# Patient Record
Sex: Male | Born: 1985 | Race: Black or African American | Hispanic: No | Marital: Single | Smoking: Current some day smoker
Health system: Southern US, Community
[De-identification: ages and names within clinical notes are randomized; demographics above are authoritative.]

## PROBLEM LIST (undated history)

## (undated) HISTORY — PX: HERNIA REPAIR: SHX51

---
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
## 2007-04-30 ENCOUNTER — Emergency Department (HOSPITAL_COMMUNITY): Admission: EM | Admit: 2007-04-30 | Discharge: 2007-04-30 | Payer: Self-pay | Admitting: Emergency Medicine

## 2007-12-18 ENCOUNTER — Encounter (INDEPENDENT_AMBULATORY_CARE_PROVIDER_SITE_OTHER): Payer: Self-pay | Admitting: Otolaryngology

## 2007-12-18 ENCOUNTER — Inpatient Hospital Stay (HOSPITAL_COMMUNITY): Admission: EM | Admit: 2007-12-18 | Discharge: 2007-12-19 | Payer: Self-pay | Admitting: *Deleted

## 2007-12-18 ENCOUNTER — Encounter: Payer: Self-pay | Admitting: Emergency Medicine

## 2008-12-07 ENCOUNTER — Emergency Department (HOSPITAL_COMMUNITY): Admission: EM | Admit: 2008-12-07 | Discharge: 2008-12-07 | Payer: Self-pay | Admitting: Emergency Medicine

## 2010-04-13 ENCOUNTER — Emergency Department (HOSPITAL_COMMUNITY)
Admission: EM | Admit: 2010-04-13 | Discharge: 2010-04-13 | Payer: Self-pay | Source: Home / Self Care | Admitting: Emergency Medicine

## 2010-06-26 LAB — RAPID STREP SCREEN (MED CTR MEBANE ONLY): Streptococcus, Group A Screen (Direct): NEGATIVE

## 2010-06-26 LAB — MONONUCLEOSIS SCREEN: Mono Screen: NEGATIVE
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
## 2010-12-11 LAB — RAPID STREP SCREEN (MED CTR MEBANE ONLY): Streptococcus, Group A Screen (Direct): POSITIVE — AB

## 2010-12-21 LAB — RAPID URINE DRUG SCREEN, HOSP PERFORMED
Amphetamines: NOT DETECTED
Barbiturates: NOT DETECTED
Benzodiazepines: NOT DETECTED
Cocaine: NOT DETECTED
Opiates: NOT DETECTED
Tetrahydrocannabinol: NOT DETECTED

## 2010-12-21 LAB — ETHANOL: Alcohol, Ethyl (B): 244 — ABNORMAL HIGH

## 2011-09-10 ENCOUNTER — Encounter (HOSPITAL_COMMUNITY): Payer: Self-pay | Admitting: *Deleted

## 2011-09-10 ENCOUNTER — Emergency Department (HOSPITAL_COMMUNITY): Payer: Self-pay

## 2011-09-10 ENCOUNTER — Emergency Department (HOSPITAL_COMMUNITY)
Admission: EM | Admit: 2011-09-10 | Discharge: 2011-09-10 | Disposition: A | Discharge status: Home / Self Care | Payer: Self-pay | Attending: Emergency Medicine | Admitting: Emergency Medicine

## 2011-09-10 DIAGNOSIS — M25539 Pain in unspecified wrist: Secondary | ICD-10-CM | POA: Insufficient documentation

## 2011-09-10 DIAGNOSIS — F17200 Nicotine dependence, unspecified, uncomplicated: Secondary | ICD-10-CM | POA: Insufficient documentation

## 2011-09-10 DIAGNOSIS — M25531 Pain in right wrist: Secondary | ICD-10-CM

## 2011-09-10 LAB — POCT I-STAT, CHEM 8
BUN: 9 mg/dL (ref 6–23)
Calcium, Ion: 1.22 mmol/L (ref 1.12–1.32)
Chloride: 103 mEq/L (ref 96–112)
Creatinine, Ser: 1.1 mg/dL (ref 0.50–1.35)
Glucose, Bld: 64 mg/dL — ABNORMAL LOW (ref 70–99)
HCT: 48 % (ref 39.0–52.0)
Hemoglobin: 16.3 g/dL (ref 13.0–17.0)
Potassium: 4.3 mEq/L (ref 3.5–5.1)
Sodium: 144 mEq/L (ref 135–145)
TCO2: 27 mmol/L (ref 0–100)

## 2011-09-10 LAB — SEDIMENTATION RATE: Sed Rate: 0 mm/hr (ref 0–16)

## 2011-09-10 MED ORDER — NAPROXEN 375 MG PO TABS: 375.0000 mg | ORAL_TABLET | Freq: Two times a day (BID) | ORAL | Status: DC

## 2011-09-10 MED ORDER — NAPROXEN 375 MG PO TABS: 375.0000 mg | ORAL_TABLET | Freq: Two times a day (BID) | ORAL | Status: AC

## 2011-09-10 NOTE — ED Provider Notes (Signed)
History     CSN: [id number]  Arrival date & time 09/10/11  4098   First MD Initiated Contact with Patient 09/10/11 [phone]      Chief Complaint  Patient presents with  . Wrist Pain    (Consider location/radiation/quality/duration/timing/severity/associated sxs/prior treatment) HPI  With complaints of bilateral wrist pain for greater than one month. He states that he does not remember any trauma to his wrist. 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 He states that they're stiff in the morning and sometimes gets shooting pain certain movements of his wrist. He denies having any pain in any other joints. Denies having fevers, chills, weight loss, penile discharge or any rash that has come and gone over the past couple months. States that he is otherwise a healthy guy with no other chronic diagnoses.  History reviewed. No pertinent past medical history.  History reviewed. No pertinent past surgical history.  No family history on file.  History  Substance Use Topics  . Smoking status: Current Some Day Smoker  . Smokeless tobacco: Not on file  . Alcohol Use: Yes      Review of Systems  CONST: no fevers, chills HEENT: denies blurry vision or change in hearing PULMONARY: Denies difficulty breathing and SOB CARDIAC: denies chest pain or heart palpitations MUSCULOSKELETAL:  denies being unable to ambulate ABDOMEN AL: denies abdominal pain GU: denies loss of bowel or urinary control NEURO: denies numbness and tingling in extremities SKIN: no new rashes PSYCH: patient behavior is normal NECK: Not complaining of neck pain     Allergies  Penicillins  Home Medications   Current Outpatient Rx  Name Route Sig Dispense Refill  . IBUPROFEN 200 MG PO TABS Oral Take 800 mg by mouth every 6 (six) hours as needed. Pain      BP 121/74  Pulse 66  Temp 98.3 F (36.8 C) (Oral)  Resp 16  SpO2  100%  Physical Exam  Nursing note and vitals reviewed. Constitutional: He appears well-developed and well-nourished. No distress.  HENT:  Head: Normocephalic and atraumatic.  Eyes: Pupils are equal, round, and reactive to light.  Neck: Normal range of motion. Neck supple.  Cardiovascular: Normal rate and regular rhythm.   Pulmonary/Chest: Effort normal.  Abdominal: Soft.  Musculoskeletal:       Right wrist: Normal.       Left wrist: Normal.       Negative Tinnels   Neurological: He is alert.  Skin: Skin is warm and dry.      ED Course  Procedures (including critical care time)  Labs Reviewed  POCT I-STAT, CHEM 8 - Abnormal; Notable for the following:    Glucose, Bld 64 (*)     All other components within normal limits  SEDIMENTATION RATE   Dg Wrist Complete Left  09/10/2011  *RADIOLOGY REPORT*  Clinical Data: Wrist pain.  LEFT WRIST - COMPLETE 3+ VIEW  Comparison: None.  Findings: There is no fracture, dislocation, joint space narrowing, or arthritis.  IMPRESSION: Normal exam.  Original Report Authenticated By: Gwynn Burly, M.D.   Dg Wrist Complete Right  09/10/2011  *RADIOLOGY REPORT*  Clinical Data: Wrist pain.  RIGHT WRIST - COMPLETE 3+ VIEW  Comparison: None.  Findings: There is a tiny erosion of the tip of the ulnar styloid with a accessory ossification adjacent to that site.  This erosions could be seen with rheumatoid arthritis.  No other  arthritic changes or other acute abnormalities.  IMPRESSION: Focal erosion of the tip of the ulnar styloid which can be seen with rheumatoid arthritis.  Original Report Authenticated By: Gwynn Burly, M.D.     1. Bilateral wrist pain       MDM  Due to poly symmetrical arthralgias without swelling or redness. I have ordered an I-stat, SED rate and bilateral wrist xrays.    I-stat is normal, aside from a low sugar. The right wrist xray shows that patient has some focal erosion of the tip of the ulnar styloid which can be  seen in RA.  SED rate is 0- will refer patient to Hand. Pt given bilateral wrist splints and started on a two week course of Naprosyn.   Pt has been advised of the symptoms that warrant their return to the ED. Patient has voiced understanding and has agreed to follow-up with the PCP or specialist.        Dorthula Matas, PA 09/10/11 1149

## 2011-09-10 NOTE — ED Notes (Signed)
Pt states he is having wrist pain in both wrist. Pt states this going on for a month. Pt states he is not sure what happened to his wrist. Pt is able to move extremities to command no deformity noted

## 2011-09-12 NOTE — ED Provider Notes (Signed)
Medical screening examination/treatment/procedure(s) were performed by non-physician practitioner and as supervising physician I was immediately available for consultation/collaboration.    Ileah Falkenstein L Guage Efferson, MD 09/12/11 0708 

## 2014-01-18 ENCOUNTER — Encounter (HOSPITAL_COMMUNITY): Payer: Self-pay | Admitting: Emergency Medicine

## 2014-01-18 ENCOUNTER — Emergency Department (HOSPITAL_COMMUNITY)
Admission: EM | Admit: 2014-01-18 | Discharge: 2014-01-19 | Disposition: A | Discharge status: Home / Self Care | Payer: Self-pay | Attending: Emergency Medicine | Admitting: Emergency Medicine

## 2014-01-18 ENCOUNTER — Emergency Department (HOSPITAL_COMMUNITY): Payer: Self-pay

## 2014-01-18 DIAGNOSIS — R52 Pain, unspecified: Secondary | ICD-10-CM

## 2014-01-18 DIAGNOSIS — Z720 Tobacco use: Secondary | ICD-10-CM | POA: Insufficient documentation

## 2014-01-18 DIAGNOSIS — Z880 Allergy status to penicillin: Secondary | ICD-10-CM | POA: Insufficient documentation

## 2014-01-18 DIAGNOSIS — L03011 Cellulitis of right finger: Secondary | ICD-10-CM | POA: Insufficient documentation

## 2014-01-18 MED ORDER — HYDROCODONE-ACETAMINOPHEN 5-325 MG PO TABS: 1.0000 | ORAL_TABLET | ORAL | Status: DC | PRN

## 2014-01-18 MED ORDER — LIDOCAINE HCL (PF) 1 % IJ SOLN
5.0000 mL | Freq: Once | INTRAMUSCULAR | Status: AC
  Administered 2014-01-18: 5 mL
  Filled 2014-01-18: qty 5

## 2014-01-18 MED ORDER — SULFAMETHOXAZOLE-TRIMETHOPRIM 800-160 MG PO TABS: 1.0000 | ORAL_TABLET | Freq: Two times a day (BID) | ORAL | Status: AC

## 2014-01-18 NOTE — Discharge Instructions (Signed)
Read the information below.  Use the prescribed medication as directed.  Please discuss all new medications with your pharmacist.  Do not take additional tylenol while taking the prescribed pain medication to avoid overdose.  You may return to the Emergency Department at any time for worsening condition or any new symptoms that concern you.  If you develop redness, swelling, pus draining from the wound, uncontrolled pain, are unable to move your finger, or fevers greater than 100.4, return to the ER immediately for a recheck.     Paronychia  Paronychia is an infection of the skin caused by germs. It happens by the fingernail or toenail. You can avoid it by not:  Pulling on hangnails.  Nail biting.  Thumb sucking.  Cutting fingernails and toenails too short.  Cutting the skin at the base and sides of the fingernail or toenail (cuticle). HOME CARE  Keep the fingers or toes very dry. Put rubber gloves over cotton gloves when putting hands in water.  Keep the wound clean and bandaged (dressed) as told by your doctor.  Soak the fingers or toes in warm water for 15 to 20 minutes. Soak them 3 to 4 times per day for germ infections. Fungal infections are difficult to treat. Fungal infections often require treatment for a long time.  Only take medicine as told by your doctor. GET HELP RIGHT AWAY IF:   You have redness, puffiness (swelling), or pain that gets worse.  You see yellowish-white fluid (pus) coming from the wound.  You have a fever.  You have a bad smell coming from the wound or bandage. MAKE SURE YOU:  Understand these instructions.  Will watch your condition.  Will get help if you are not doing well or get worse. Document Released: 02/24/2009 Document Revised: 05/31/2011 Document Reviewed: 02/24/2009 [facility] Patient Information 2015 [employer] [place]. This information is not intended to replace advice given to you by your health care provider. Make sure you discuss any  questions you have with your health care provider.

## 2014-01-18 NOTE — ED Notes (Signed)
Sl swelling noted distal phalanx palmar surface

## 2014-01-18 NOTE — ED Provider Notes (Signed)
CSN: [id number]     Arrival date & time 01/18/14  2103 History  This chart was scribed for [facility] Institute D[facility] Jersey Mus[facility] Institute LLCEmily Yoselyn Mcglade, GeorgiaPA, working with Raelyn NumberKristen N Ward, DO found by Elon SpannerGarrett Cook, ED Scribe. This patient was seen in room TR04C/TR04C and the patient's care was started at 9:55 PM.  Chief Complaint  Patient presents with  . finger pain    The history is provided by the patient. No language interpreter was used.   HPI Comments: William Marsh is a 28 y.o. male who presents to the Emergency Department complaining of right 1st finger pain onset 1 month ago with general worsening and associated swelling onset 2 days ago. 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  William Marsh reports at baseline his pain is 3/10 but states if William Marsh aggravates the area by hitting it on something the pain is 10/10.  Patient denies fever, drainage, weakness, numbness.  Patient is allergic to penicillin.  History reviewed. No pertinent past medical history. History reviewed. No pertinent past surgical history. No family history on file. History  Substance Use Topics  . Smoking status: Current Some Day Smoker  . Smokeless tobacco: Not on file  . Alcohol Use: Yes    Review of Systems  Constitutional: Negative for fever and chills.  Musculoskeletal: Negative for arthralgias and joint swelling.  Skin: Negative for color change and wound.  Allergic/Immunologic: Negative for immunocompromised state.  Neurological: Negative for weakness and numbness.  Psychiatric/Behavioral: Negative for self-injury.      Allergies  Penicillins  Home Medications   Prior to Admission medications   Medication Sig Start Date End Date Taking? Authorizing Provider  ibuprofen (ADVIL,MOTRIN) 200 MG tablet Take 800 mg by mouth every 6 (six) hours as needed. Pain    Historical Provider, MD   BP 131/70  Pulse 90  Temp(Src) 98.5 F (36.9 C)  Resp 18  Ht 6' (1.829 m)  Wt 170 lb (77.111 kg)  BMI 23.05 kg/m2  SpO2 100% Physical Exam   Nursing note and vitals reviewed. Constitutional: William Marsh appears well-developed and well-nourished. No distress.  HENT:  Head: Normocephalic and atraumatic.  Neck: Neck supple.  Pulmonary/Chest: Effort normal.  Musculoskeletal:  Right index finger with swelling and tenderness along the nailfold and diffuse edema of the surrounding pulp.  Not tender throughout digital pulp.  Compartment is soft.  No area of fluctuance.  No discharge.  No overlying erythema.   Neurological: William Marsh is alert.  Skin: William Marsh is not diaphoretic.    ED Course  Procedures (including critical care time)  DIAGNOSTIC STUDIES: Oxygen Saturation is 100% on RA, normal by my interpretation.    COORDINATION OF CARE:  10:04 PM Will perform I&D.  Patient acknowledges and agrees with plan.    Labs Review Labs Reviewed - No data to display  Imaging Review No results found.   EKG Interpretation None      INCISION AND DRAINAGE Performed by: Trixie DredgeWEST, Banks Chaikin Consent: Verbal consent obtained. Risks and benefits: risks, benefits and alternatives were discussed Type: abscess  Body area: right index finger, paronychia   Anesthesia: digital block  Incision was made with a scalpel.  Local anesthetic: lidocaine 2% no epinephrine  Anesthetic total: 4 ml  Complexity: simple  Drainage: purulent  Drainage amount: moderate  Packing material: none  Patient tolerance: Patient tolerated the procedure well with no immediate complications.    MDM   Final diagnoses:  Paronychia of finger of right hand    Afebrile, nontoxic patient with  paronychia of right index finger, dominant hand.  Doubt felon or herpetic whitlow.  I&D with moderate amount of drainage.   D/C home with bactrim, norco.  Discussed return precautions with patient.    Discussed result, findings, treatment, and follow up  with patient.  Pt given return precautions.  Pt verbalizes understanding and agrees with plan.       I personally performed the services  described in this documentation, which was scribed in my presence. The recorded information has been reviewed and is accurate.    Trixie DredgeEmily Amauri Keefe, PA-C 01/19/14 0040

## 2014-01-18 NOTE — ED Notes (Signed)
The pt has had rt index finger pain   For 3 weeks .  He does not know if he has injured it.  No swelling

## 2014-01-19 MED ORDER — IBUPROFEN 400 MG PO TABS
400.0000 mg | ORAL_TABLET | Freq: Once | ORAL | Status: AC
  Administered 2014-01-19: 400 mg via ORAL
  Filled 2014-01-19: qty 1

## 2014-01-19 NOTE — ED Provider Notes (Signed)
Medical screening examination/treatment/procedure(s) were performed by non-physician practitioner and as supervising physician I was immediately available for consultation/collaboration.   EKG Interpretation None        Layla MawKristen N Hendrick Pavich, DO 01/19/14 [phone]

## 2014-08-01 ENCOUNTER — Emergency Department (HOSPITAL_COMMUNITY): Payer: Self-pay

## 2014-08-01 ENCOUNTER — Encounter (HOSPITAL_COMMUNITY): Payer: Self-pay | Admitting: Emergency Medicine

## 2014-08-01 ENCOUNTER — Emergency Department (HOSPITAL_COMMUNITY)
Admission: EM | Admit: 2014-08-01 | Discharge: 2014-08-01 | Disposition: A | Discharge status: Home / Self Care | Payer: Self-pay | Attending: Emergency Medicine | Admitting: Emergency Medicine

## 2014-08-01 DIAGNOSIS — Z720 Tobacco use: Secondary | ICD-10-CM | POA: Insufficient documentation

## 2014-08-01 DIAGNOSIS — M25531 Pain in right wrist: Secondary | ICD-10-CM | POA: Insufficient documentation

## 2014-08-01 DIAGNOSIS — L301 Dyshidrosis [pompholyx]: Secondary | ICD-10-CM | POA: Insufficient documentation

## 2014-08-01 DIAGNOSIS — Z880 Allergy status to penicillin: Secondary | ICD-10-CM | POA: Insufficient documentation

## 2014-08-01 DIAGNOSIS — G8929 Other chronic pain: Secondary | ICD-10-CM | POA: Insufficient documentation

## 2014-08-01 MED ORDER — HYDROCORTISONE 1 % EX CREA: TOPICAL_CREAM | CUTANEOUS | Status: DC

## 2014-08-01 MED ORDER — NAPROXEN 500 MG PO TABS: 500.0000 mg | ORAL_TABLET | Freq: Two times a day (BID) | ORAL | Status: DC

## 2014-08-01 NOTE — ED Provider Notes (Signed)
CSN: [id number]     Arrival date & time 08/01/14  1252 History  This chart was scribed for Joycie PeekBenjamin Savyon Loken, PA-C, working with Elwin MochaBlair Walden, MD by Elon SpannerGarrett Cook, ED Scribe. This patient was seen in room TR07C/TR07C and the patient's care was started at 1:08 PM.   Chief Complaint  Patient presents with  . Wrist Pain  . Hand Pain   Patient is a 29 y.o. male presenting with hand pain. The history is provided by the patient. No language interpreter was used.  Hand Pain   HPI Comments: William Marsh is a 29 y.o. male who presents to the Emergency Department complaining of right medial wrist pain onset yesterday upon awaking without injury.  He notes limited mobility in the thumb and "shocking" pain with rotational movements and touching.    The patient has not taken any medications for this complaint and there are no alleviating factors.  Patient reports he had a similar complaint 3 years ago and was seen in the ED where he was treated with a wrist splint and the complaint resolved.  The onset of this prior complaint was gradual and the patient worked lifting boxes at the time.  Patient is allergic to Penicillin.  He also complains of left middle finger, non-tender skin peeling and itching.  He notes he previously had a similar complaint on the left hand that required an incision and drainage.  He has used cocoa butter and Vaseline.  He works as Public affairs consultantdishwasher.  Patient denies fever, chills, dysuria, difficulty urinating, penile discharge, other pain.    History reviewed. No pertinent past medical history. History reviewed. No pertinent past surgical history. No family history on file. History  Substance Use Topics  . Smoking status: Current Some Day Smoker  . Smokeless tobacco: Not on file  . Alcohol Use: Yes    Review of Systems  Constitutional: Negative for fever and chills.  Genitourinary: Negative for dysuria, discharge and difficulty urinating.  Musculoskeletal: Positive for arthralgias.   Skin: Positive for wound.  All other systems reviewed and are negative.     Allergies  Penicillins  Home Medications   Prior to Admission medications   Medication Sig Start Date End Date Taking? Authorizing Provider  HYDROcodone-acetaminophen (NORCO/VICODIN) 5-325 MG per tablet Take 1-2 tablets by mouth every 4 (four) hours as needed for moderate pain or severe pain. 01/18/14   Trixie DredgeEmily West, PA-C  hydrocortisone cream 1 % Apply to affected area 2 times daily 08/01/14   Joycie PeekBenjamin Talin Rozeboom, PA-C  ibuprofen (ADVIL,MOTRIN) 200 MG tablet Take 800 mg by mouth every 6 (six) hours as needed. Pain    Historical Provider, MD  naproxen (NAPROSYN) 500 MG tablet Take 1 tablet (500 mg total) by mouth 2 (two) times daily. 08/01/14   Wyatte Dames, PA-C   BP 113/70 mmHg  Pulse 60  Temp(Src) 98.1 F (36.7 C) (Oral)  Resp 14  Ht 6\' 1"  (1.854 m)  Wt 177 lb (80.287 kg)  BMI 23.36 kg/m2  SpO2 100% Physical Exam  Constitutional: He is oriented to person, place, and time. He appears well-developed and well-nourished. No distress.  HENT:  Head: Normocephalic and atraumatic.  Mouth/Throat: Oropharynx is clear and moist.  No lesions  Eyes: Conjunctivae and EOM are normal.  Neck: Neck supple. No tracheal deviation present.  Cardiovascular: Normal rate.   Pulmonary/Chest: Effort normal. No respiratory distress.  Abdominal: Soft. There is no tenderness.  Musculoskeletal: Normal range of motion.  Right wrist: TTP over radial styloid with no  obvious lesions or deformities; decreased ROM with wrist flexion secondary to pain; full active ROM with all digits.    Neurological: He is alert and oriented to person, place, and time.  Negative Tinel's. Sensation intact to light touch. Motor 5/5. Grip strength intact.  Skin: Skin is warm and dry.  Left hand middle finger: mild scaling and skin breakdown throughout entire digit; some small deep vesicular lesions; no active drainage; no erythema. Non painful, full  active ROM.  No TTP.   Psychiatric: He has a normal mood and affect. His behavior is normal.  Nursing note and vitals reviewed.   ED Course  Procedures (including critical care time)  DIAGNOSTIC STUDIES: Oxygen Saturation is 100% on RA, normal by my interpretation.    COORDINATION OF CARE:  1:18 PM Discussed treatment plan with patient at bedside.  Patient acknowledges and agrees with plan.    Labs Review Labs Reviewed - No data to display  Imaging Review Dg Wrist Complete Right  08/01/2014   CLINICAL DATA:  Right-sided wrist pain, no known injury, initial encounter  EXAM: RIGHT WRIST - COMPLETE 3+ VIEW  COMPARISON:  None.  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 No other focal bony abnormality is seen. No soft tissue changes are noted.  IMPRESSION: No acute abnormality noted.   Electronically Signed   By: Alcide CleverMark  Lukens M.D.   On: 08/01/2014 13:38     EKG Interpretation None        MDM   Vitals stable - WNL -afebrile Pt resting comfortably in ED. Wrist discomfort appears to be secondary to musculoskeletal strain. No obvious bacterial or other immunologic processes evident. Skin manifestations on left middle finger appear to be dyshidrotic eczema.No evidence of felon or paronychia. No other clinical signs of viral infection. No evidence of herpetic whitlow, no erythema or tenderness.  Imaging--x-ray right wrist shows no acute fractures, dislocations or other bony pathology.  DDX--we'll give wrist splint, initiate trial of NSAIDs.  Also DC with steroid cream and instructions to wear elbow length gloves while washing dishes. Continue with hand lotions at home  I discussed all relevant lab findings and imaging results with pt and they verbalized understanding. Discussed f/u with PCP within 48 hrs and return precautions, pt very amenable to plan.    Final diagnoses:  Wrist pain, chronic, right  Dyshidrotic eczema   I personally performed the  services described in this documentation, which was scribed in my presence. The recorded information has been reviewed and is accurate.   Joycie PeekBenjamin Genaro Bekker, PA-C 08/01/14 1439  Elwin MochaBlair Walden, MD 08/01/14 [phone]

## 2014-08-01 NOTE — Discharge Instructions (Signed)
Please take your medications as directed for your wrist pain and rash on your finger. He may apply the cream to your finger as directed on the tube. It is important to follow-up with primary care for further evaluation and management of your symptoms. Return to ED for new or worsening symptoms.  Arthralgia Your caregiver has diagnosed you as suffering from an arthralgia. Arthralgia means there is pain in a joint. This can come from many reasons including:  Bruising the joint which causes soreness (inflammation) in the joint.  Wear and tear on the joints which occur as we grow older (osteoarthritis).  Overusing the joint.  Various forms of arthritis.  Infections of the joint. Regardless of the cause of pain in your joint, most of these different pains respond to anti-inflammatory drugs and rest. The exception to this is when a joint is infected, and these cases are treated with antibiotics, if it is a bacterial infection. HOME CARE INSTRUCTIONS   Rest the injured area for as long as directed by your caregiver. Then slowly start using the joint as directed by your caregiver and as the pain allows. Crutches as directed may be useful if the ankles, knees or hips are involved. If the knee was splinted or casted, continue use and care as directed. If an stretchy or elastic wrapping bandage has been applied today, it should be removed and re-applied every 3 to 4 hours. It should not be applied tightly, but firmly enough to keep swelling down. Watch toes and feet for swelling, bluish discoloration, coldness, numbness or excessive pain. If any of these problems (symptoms) occur, remove the ace bandage and re-apply more loosely. If these symptoms persist, contact your caregiver or return to this location.  For the first 24 hours, keep the injured extremity elevated on pillows while lying down.  Apply ice for 15-20 minutes to the sore joint every couple hours while awake for the first half day. Then 03-04  times per day for the first 48 hours. Put the ice in a plastic bag and place a towel between the bag of ice and your skin.  Wear any splinting, casting, elastic bandage applications, or slings as instructed.  Only take over-the-counter or prescription medicines for pain, discomfort, or fever as directed by your caregiver. Do not use aspirin immediately after the injury unless instructed by your physician. Aspirin can cause increased bleeding and bruising of the tissues.  If you were given crutches, continue to use them as instructed and do not resume weight bearing on the sore joint until instructed. Persistent pain and inability to use the sore joint as directed for more than 2 to 3 days are warning signs indicating that you should see a caregiver for a follow-up visit as soon as possible. Initially, a hairline fracture (break in bone) may not be evident on X-rays. Persistent pain and swelling indicate that further evaluation, non-weight bearing or use of the joint (use of crutches or slings as instructed), or further X-rays are indicated. X-rays may sometimes not show a small fracture until a week or 10 days later. Make a follow-up appointment with your own caregiver or one to whom we have referred you. A radiologist (specialist in reading X-rays) may read your X-rays. Make sure you know how you are to obtain your X-ray results. Do not assume everything is normal if you do not hear from [place]. SEEK MEDICAL CARE IF: Bruising, swelling, or pain increases. SEEK IMMEDIATE MEDICAL CARE IF:   Your fingers or toes  are numb or blue.  The pain is not responding to medications and continues to stay the same or get worse.  The pain in your joint becomes severe.  You develop a fever over 102 F (38.9 C).  It becomes impossible to move or use the joint. MAKE SURE YOU:   Understand these instructions.  Will watch your condition.  Will get help right away if you are not doing well or get worse. Document  Released: 03/08/2005 Document Revised: 05/31/2011 Document Reviewed: 10/25/2007 [facility]ExitCare Patient Information 2015 [place]ExitCare, [place]. This information is not intended to replace advice given to you by your health care provider. Make sure you discuss any questions you have with your health care provider.  Hand Dermatitis Hand dermatitis (dyshidrotic eczema) is a skin condition in which small, itchy, raised dots or fluid-filled blisters form over the palms of the hands. Outbreaks of hand dermatitis can last 3 to 4 weeks. CAUSES  The cause of hand dermatitis is unknown. However, it occurs most often in patients with a history of allergies such as:  Hay fever.  Allergic asthma.  Allergies to latex. Chemical exposure, injuries, and environmental irritants can make hand dermatitis worse. Washing your hands too frequently can remove natural oils, which can dry out the skin and contribute to outbreaks of hand dermatitis. SYMPTOMS  The most common symptom of hand dermatitis is intense itching. Cracks or grooves (fissures) on the fingers can also develop. Affected areas can be painful, especially areas where large blisters have formed. DIAGNOSIS Your caregiver can usually tell what the problem is by doing a physical exam. PREVENTION  Avoid excessive hand washing.  Avoid the use of harsh chemicals.  Wear protective gloves when handling products that can irritate your skin. TREATMENT  Steroid creams and ointments, such as over-the-counter 1% hydrocortisone cream, can reduce inflammation and improve moisture retention. These should be applied at least 2 to 4 times per day. Your caregiver may ask you to use a stronger prescription steroid cream to help speed the healing of blistered and cracked skin. In severe cases, oral steroid medicine may be needed. If you have an infection, antibiotics may be needed. Your caregiver may also prescribe antihistamines. These medicines help reduce itching. HOME CARE  INSTRUCTIONS  Only take over-the-counter or prescription medicines as directed by your caregiver.  You may use wet or cold compresses. This can help:  Alleviate itching.  Increase the effectiveness of topical creams.  Minimize blisters. SEEK MEDICAL CARE IF:  The rash is not better after 1 week of treatment.  Signs of infection develop, such as redness, tenderness, or yellowish-white fluid (pus).  The rash is spreading. Document Released: 03/08/2005 Document Revised: 05/31/2011 Document Reviewed: 08/05/2010 [facility] TrentonExitCare Patient Information 2015 [place]ExitCare, [place]LL[place]. This information is not intended to replace advice given to you by your health care provider. Make sure you discuss any questions you have with your health care provider.  Eczema Eczema, also called atopic dermatitis, is a skin disorder that causes inflammation of the skin. It causes a red rash and dry, scaly skin. The skin becomes very itchy. Eczema is generally worse during the cooler winter months and often improves with the warmth of summer. Eczema usually starts showing signs in infancy. Some children outgrow eczema, but it may last through adulthood.  CAUSES  The exact cause of eczema is not known, but it appears to run in families. People with eczema often have a family history of eczema, allergies, asthma, or hay fever. Eczema is not contagious.  Flare-ups of the condition may be caused by:   Contact with something you are sensitive or allergic to.   Stress. SIGNS AND SYMPTOMS  Dry, scaly skin.   Red, itchy rash.   Itchiness. This may occur before the skin rash and may be very intense.  DIAGNOSIS  The diagnosis of eczema is usually made based on symptoms and medical history. TREATMENT  Eczema cannot be cured, but symptoms usually can be controlled with treatment and other strategies. A treatment plan might include:  Controlling the itching and scratching.   Use over-the-counter antihistamines as directed for  itching. This is especially useful at night when the itching tends to be worse.   Use over-the-counter steroid creams as directed for itching.   Avoid scratching. Scratching makes the rash and itching worse. It may also result in a skin infection (impetigo) due to a break in the skin caused by scratching.   Keeping the skin well moisturized with creams every day. This will seal in moisture and help prevent dryness. Lotions that contain alcohol and water should be avoided because they can dry the skin.   Limiting exposure to things that you are sensitive or allergic to (allergens).   Recognizing situations that cause stress.   Developing a plan to manage stress.  HOME CARE INSTRUCTIONS   Only take over-the-counter or prescription medicines as directed by your health care provider.   Do not use anything on the skin without checking with your health care provider.   Keep baths or showers short (5 minutes) in warm (not hot) water. Use mild cleansers for bathing. These should be unscented. You may add nonperfumed bath oil to the bath water. It is best to avoid soap and bubble bath.   Immediately after a bath or shower, when the skin is still damp, apply a moisturizing ointment to the entire body. This ointment should be a petroleum ointment. This will seal in moisture and help prevent dryness. The thicker the ointment, the better. These should be unscented.   Keep fingernails cut short. Children with eczema may need to wear soft gloves or mittens at night after applying an ointment.   Dress in clothes made of cotton or cotton blends. Dress lightly, because heat increases itching.   A child with eczema should stay away from anyone with fever blisters or cold sores. The virus that causes fever blisters (herpes simplex) can cause a serious skin infection in children with eczema. SEEK MEDICAL CARE IF:   Your itching interferes with sleep.   Your rash gets worse or is not better  within 1 week after starting treatment.   You see pus or soft yellow scabs in the rash area.   You have a fever.   You have a rash flare-up after contact with someone who has fever blisters.  Document Released: 03/05/2000 Document Revised: 12/27/2012 Document Reviewed: 10/09/2012 [facility] IncExitCare Patient Information 2015 [place]ExitCare, [place]LLC. This information is not intended to replace advice given to you by your health care provider. Make sure you discuss any questions you have with your health care provider.

## 2014-08-01 NOTE — ED Notes (Signed)
Pt c/o right wrist pain - States had this pain 3 years ago but it went away. Started again yesterday. No further injury. ALSO, c/o dry, cracking skin on left middle finger.

## 2018-11-20 ENCOUNTER — Other Ambulatory Visit: Payer: Self-pay

## 2018-11-20 DIAGNOSIS — Z20822 Contact with and (suspected) exposure to covid-19: Secondary | ICD-10-CM

## 2018-11-22 LAB — NOVEL CORONAVIRUS, NAA: SARS-CoV-2, NAA: NOT DETECTED

## 2020-01-14 ENCOUNTER — Encounter (HOSPITAL_COMMUNITY): Payer: Self-pay

## 2020-01-14 ENCOUNTER — Emergency Department (HOSPITAL_COMMUNITY)
Admission: EM | Admit: 2020-01-14 | Discharge: 2020-01-14 | Disposition: A | Discharge status: Home / Self Care | Payer: Self-pay | Attending: Emergency Medicine | Admitting: Emergency Medicine

## 2020-01-14 ENCOUNTER — Other Ambulatory Visit: Payer: Self-pay

## 2020-01-14 DIAGNOSIS — S025XXB Fracture of tooth (traumatic), initial encounter for open fracture: Secondary | ICD-10-CM | POA: Insufficient documentation

## 2020-01-14 DIAGNOSIS — F17200 Nicotine dependence, unspecified, uncomplicated: Secondary | ICD-10-CM | POA: Insufficient documentation

## 2020-01-14 MED ORDER — LIDOCAINE VISCOUS HCL 2 % MT SOLN: 15.0000 mL | OROMUCOSAL | 2 refills | Status: DC | PRN

## 2020-01-14 MED ORDER — IBUPROFEN 600 MG PO TABS: 600.0000 mg | ORAL_TABLET | Freq: Four times a day (QID) | ORAL | 0 refills | Status: DC | PRN

## 2020-01-14 MED ORDER — AMOXICILLIN-POT CLAVULANATE 875-125 MG PO TABS
1.0000 | ORAL_TABLET | Freq: Once | ORAL | Status: AC
Start: 2020-01-14 — End: 2020-01-14
  Administered 2020-01-14: 1 via ORAL
  Filled 2020-01-14: qty 1

## 2020-01-14 MED ORDER — AMOXICILLIN-POT CLAVULANATE 875-125 MG PO TABS: 1.0000 | ORAL_TABLET | Freq: Two times a day (BID) | ORAL | 0 refills | Status: DC
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
## 2020-01-14 NOTE — Discharge Instructions (Signed)
°  Dental Pain You have been seen today for dental pain. You should follow up with a dentist tomorrow. This problem will not resolve on its own without the care of a dentist.  Lidocaine liquid: Use the viscous lidocaine for mouth pain, but only if the protective dental covering falls off. Swish with the lidocaine and spit it out. Do not swallow it. Antiinflammatory medications: Take 600 mg of ibuprofen every 6 hours or 440 mg (over the counter dose) to 500 mg (prescription dose) of naproxen every 12 hours for the next 3 days. After this time, these medications may be used as needed for pain. Take these medications with food to avoid upset stomach. Choose only one of these medications, do not take them together. Acetaminophen (generic for Tylenol): Should you continue to have additional pain while taking the ibuprofen or naproxen, you may add in acetaminophen as needed. Your daily total maximum amount of acetaminophen from all sources should be limited to 4000mg /day for persons without liver problems, or 2000mg /day for those with liver problems.  Please take all of your antibiotics until finished!   You may develop abdominal discomfort or diarrhea from the antibiotic.  You may help offset this with probiotics which you can buy or get in yogurt. Do not eat or take the probiotics until 2 hours after your antibiotic.   For prescription assistance, may try using prescription discount sites or apps, such as goodrx.com

## 2020-05-04 ENCOUNTER — Other Ambulatory Visit: Payer: Self-pay

## 2020-05-04 ENCOUNTER — Encounter (HOSPITAL_COMMUNITY): Payer: Self-pay

## 2020-05-04 ENCOUNTER — Emergency Department (HOSPITAL_COMMUNITY)
Admission: EM | Admit: 2020-05-04 | Discharge: 2020-05-05 | Disposition: A | Discharge status: Home / Self Care | Payer: Self-pay | Attending: Emergency Medicine | Admitting: Emergency Medicine

## 2020-05-04 DIAGNOSIS — F17200 Nicotine dependence, unspecified, uncomplicated: Secondary | ICD-10-CM | POA: Insufficient documentation

## 2020-05-04 DIAGNOSIS — K047 Periapical abscess without sinus: Secondary | ICD-10-CM | POA: Insufficient documentation

## 2020-05-04 MED ORDER — AMOXICILLIN 500 MG PO CAPS: 500.0000 mg | ORAL_CAPSULE | Freq: Three times a day (TID) | ORAL | 0 refills | Status: DC

## 2020-05-04 MED ORDER — NAPROXEN 500 MG PO TABS: 500.0000 mg | ORAL_TABLET | Freq: Two times a day (BID) | ORAL | 0 refills | Status: DC

## 2020-05-04 MED ORDER — KETOROLAC TROMETHAMINE 60 MG/2ML IM SOLN
60.0000 mg | Freq: Once | INTRAMUSCULAR | Status: AC
  Administered 2020-05-04: 60 mg via INTRAMUSCULAR
  Filled 2020-05-04: qty 2

## 2020-05-04 NOTE — ED Triage Notes (Signed)
Pt sts left lower dental pain since last night.

## 2020-05-04 NOTE — Discharge Instructions (Addendum)
Apply hot compresses several times a day Amoxicillin 3 times a day for 10 days Naproxen twice a day as needed for pain See your dentist within 10 days as you may need to have some dental work done to prevent this from happening again

## 2020-05-04 NOTE — ED Provider Notes (Addendum)
[facility] Provider Note   CSN: [id number] Arrival date & time: 05/04/20  2216     History Chief Complaint  Patient presents with  . Dental Pain    William Marsh is a 35 y.o. male.  HPI   35 year old male, history of multiple dental abscesses in the past, at this time the patient has what appears to be some swelling over the left lower jaw, it is been present for several days gradually getting worse, no fevers, worse with trying to chew, not associated with any difficulty swallowing or breathing or speaking.  He is taken Tylenol prior to arrival without relief.  History reviewed. No pertinent past medical history.  There are no problems to display for this patient.   History reviewed. No pertinent surgical history.     No family history on file.  Social History   Tobacco Use  . Smoking status: Current Some Day Smoker  . Smokeless tobacco: Never Used  Substance Use Topics  . Alcohol use: Yes  . Drug use: No    Home Medications Prior to Admission medications   Medication Sig Start Date End Date Taking? Authorizing Provider  amoxicillin (AMOXIL) 500 MG capsule Take 1 capsule (500 mg total) by mouth 3 (three) times daily. 05/04/20  Yes Eber Hong, MD  naproxen (NAPROSYN) 500 MG tablet Take 1 tablet (500 mg total) by mouth 2 (two) times daily with a meal. 05/04/20  Yes Eber Hong, MD  hydrocortisone cream 1 % Apply to affected area 2 times daily 08/01/14   Joycie Peek, PA-C    Allergies    Penicillins  Review of Systems   Review of Systems  Constitutional: Negative for fever.  HENT: Positive for dental problem.     Physical Exam Updated Vital Signs BP (!) 147/102 (BP Location: Right Arm)   Pulse 81   Temp 98.2 F (36.8 C) (Oral)   Resp 18   Ht 1.854 m (6\' 1" )   Wt 80.3 kg   SpO2 99%   BMI 23.35 kg/m   Physical Exam Vitals and nursing note reviewed.  Constitutional:      General: He is not in acute  distress.    Appearance: He is well-developed and well-nourished. He is not diaphoretic.  HENT:     Head: Normocephalic and atraumatic.     Nose: No congestion or rhinorrhea.     Mouth/Throat:     Mouth: Oropharynx is clear and moist.     Pharynx: No oropharyngeal exudate.     Comments: Mild fluctuant abscess abutting the first lower molar on the left.  Dentition appears to be otherwise generally intact.  The patient is able to open and close his mouth without difficulty, no trismus or torticollis     Comments: Dental DiseaseEyes:     General: No scleral icterus.    Conjunctiva/sclera: Conjunctivae normal.  Neck:     Thyroid: No thyromegaly.  Cardiovascular:     Rate and Rhythm: Normal rate and regular rhythm.  Pulmonary:     Effort: Pulmonary effort is normal.     Breath sounds: Normal breath sounds.  Musculoskeletal:     Cervical back: Normal range of motion and neck supple.  Lymphadenopathy:     Cervical: No cervical adenopathy.  Skin:    General: Skin is warm and dry.     Findings: No rash.  Neurological:     Mental Status: He is alert.     ED Results / Procedures / Treatments  Labs (all labs ordered are listed, but only abnormal results are displayed) Labs Reviewed - No data to display  EKG None  Radiology No results found.  Procedures Procedures   Medications Ordered in ED Medications - No data to display  ED Course  I have reviewed the triage vital signs and the nursing notes.  Pertinent labs & imaging results that were available during my care of the patient were reviewed by me and considered in my medical decision making (see chart for details).    MDM Rules/Calculators/A&P                          Neck supple without lymphadenopathy or tenderness.  No signs of Ludwick's angina, dental abscess, starting to spontaneously drain, amoxicillin has been tolerated the past without difficulty despite penicillin allergy, anti-inflammatories, patient able to  follow-up.  Stable for discharge  Final Clinical Impression(s) / ED Diagnoses Final diagnoses:  Dental abscess    Rx / DC Orders ED Discharge Orders         Ordered    amoxicillin (AMOXIL) 500 MG capsule  3 times daily        05/04/20 2242    naproxen (NAPROSYN) 500 MG tablet  2 times daily with meals        05/04/20 2242           Eber Hong, MD 05/04/20 2244    Eber Hong, MD 05/04/20 2244

## 2020-05-05 MED ORDER — NAPROXEN 500 MG PO TABS: 500.0000 mg | ORAL_TABLET | Freq: Two times a day (BID) | ORAL | 0 refills | Status: DC

## 2020-05-05 MED ORDER — CLINDAMYCIN HCL 150 MG PO CAPS: 150.0000 mg | ORAL_CAPSULE | Freq: Four times a day (QID) | ORAL | 0 refills | Status: DC

## 2020-05-05 NOTE — ED Notes (Signed)
Had to reopen chart to resend prescriptions to a different pharmacy.

## 2020-07-02 ENCOUNTER — Emergency Department (HOSPITAL_COMMUNITY)
Admission: EM | Admit: 2020-07-02 | Discharge: 2020-07-02 | Disposition: A | Discharge status: Home / Self Care | Payer: Self-pay | Attending: Emergency Medicine | Admitting: Emergency Medicine

## 2020-07-02 ENCOUNTER — Encounter (HOSPITAL_COMMUNITY): Payer: Self-pay | Admitting: *Deleted

## 2020-07-02 ENCOUNTER — Emergency Department (HOSPITAL_COMMUNITY): Payer: Self-pay

## 2020-07-02 DIAGNOSIS — F17200 Nicotine dependence, unspecified, uncomplicated: Secondary | ICD-10-CM | POA: Insufficient documentation

## 2020-07-02 DIAGNOSIS — R059 Cough, unspecified: Secondary | ICD-10-CM | POA: Insufficient documentation

## 2020-07-02 DIAGNOSIS — R062 Wheezing: Secondary | ICD-10-CM | POA: Insufficient documentation

## 2020-07-02 DIAGNOSIS — R0609 Other forms of dyspnea: Secondary | ICD-10-CM | POA: Insufficient documentation

## 2020-07-02 DIAGNOSIS — R0981 Nasal congestion: Secondary | ICD-10-CM | POA: Insufficient documentation

## 2020-07-02 DIAGNOSIS — Z20822 Contact with and (suspected) exposure to covid-19: Secondary | ICD-10-CM | POA: Insufficient documentation

## 2020-07-02 LAB — POC SARS CORONAVIRUS 2 AG -  ED: SARS Coronavirus 2 Ag: NEGATIVE

## 2020-07-02 MED ORDER — PREDNISONE 50 MG PO TABS: 50.0000 mg | ORAL_TABLET | Freq: Every day | ORAL | 0 refills | Status: DC

## 2020-07-02 MED ORDER — IPRATROPIUM-ALBUTEROL 0.5-2.5 (3) MG/3ML IN SOLN
3.0000 mL | Freq: Once | RESPIRATORY_TRACT | Status: AC
Start: 2020-07-02 — End: 2020-07-02
  Administered 2020-07-02: 3 mL via RESPIRATORY_TRACT
  Filled 2020-07-02: qty 3

## 2020-07-02 MED ORDER — AEROCHAMBER PLUS FLO-VU MEDIUM MISC: 1.0000 | Freq: Once | Status: DC

## 2020-07-02 MED ORDER — ALBUTEROL SULFATE HFA 108 (90 BASE) MCG/ACT IN AERS
4.0000 | INHALATION_SPRAY | Freq: Once | RESPIRATORY_TRACT | Status: AC
  Administered 2020-07-02: 4 via RESPIRATORY_TRACT
  Filled 2020-07-02: qty 6.7

## 2020-07-02 NOTE — Discharge Instructions (Addendum)
You were seen in the emergency department today for trouble breathing.  Your Covid test was negative.  Your chest x-ray did not show any pneumonia.  Please use the inhaler we provided 1 to 2 puffs every 4-6 hours as needed for trouble breathing/wheezing  We are sending home with a course of steroids, prednisone, please take this daily with breakfast over the next 5 days.  We have prescribed you new medication(s) today. Discuss the medications prescribed today with your pharmacist as they can have adverse effects and interactions with your other medicines including over the counter and prescribed medications. Seek medical evaluation if you start to experience new or abnormal symptoms after taking one of these medicines, seek care immediately if you start to experience difficulty breathing, feeling of your throat closing, facial swelling, or rash as these could be indications of a more serious allergic reaction  Please continue to take Zyrtec probably counter dosing.  Please follow-up with your primary care provider within 3 days for reevaluation.  Return to the emergency department for new or worsening symptoms including but not limited to increased work of breathing, passing out, chest pain, coughing up blood, fever, or any other concerns.

## 2020-07-02 NOTE — ED Provider Notes (Signed)
[facility]-EMERGENCY DEPT Provider Note   CSN: [id number] Arrival date & time: 07/02/20  1021     History Chief Complaint  Patient presents with  . Shortness of Breath    William Marsh is a 35 y.o. male with a hx of tobacco abuse who presents to the ED with complaints of dyspnea x 1 week. Patient reports sxs of nasal congestion, cough, chest congestion, dry cough, wheezing, & dyspnea. Feels that this was triggered by the pollen, tried zyrtec which has helped some, no other alleviating/aggravating factors. Denies fever, chills, leg pain/swelling, hemoptysis, recent surgery/trauma, recent long travel, hormone use, personal hx of cancer, or hx of DVT/PE.    HPI     History reviewed. No pertinent past medical history.  There are no problems to display for this patient.   History reviewed. No pertinent surgical history.     No family history on file.  Social History   Tobacco Use  . Smoking status: Current Some Day Smoker  . Smokeless tobacco: Never Used  Substance Use Topics  . Alcohol use: Yes  . Drug use: No    Home Medications Prior to Admission medications   Medication Sig Start Date End Date Taking? Authorizing Provider  clindamycin (CLEOCIN) 150 MG capsule Take 1 capsule (150 mg total) by mouth every 6 (six) hours. 05/05/20   Palumbo, April, MD  hydrocortisone cream 1 % Apply to affected area 2 times daily 08/01/14   Cartner, Sharlet [place], PA-C  naproxen (NAPROSYN) 500 MG tablet Take 1 tablet (500 mg total) by mouth 2 (two) times daily with a meal. 05/05/20   Palumbo, April, MD    Allergies    Penicillins  Review of Systems   Review of Systems  Constitutional: Negative for chills and fever.  HENT: Positive for congestion. Negative for ear pain.   Respiratory: Positive for cough, shortness of breath and wheezing.   Cardiovascular: Negative for chest pain and leg swelling.  Gastrointestinal: Negative for abdominal pain, nausea and vomiting.   Neurological: Negative for syncope.  All other systems reviewed and are negative.   Physical Exam Updated Vital Signs BP 114/81 (BP Location: Left Arm)   Pulse 90   Temp (!) 97.5 F (36.4 C) (Oral)   Resp 18   SpO2 96%   Physical Exam Vitals and nursing note reviewed.  Constitutional:      General: He is not in acute distress.    Appearance: He is well-developed. He is not toxic-appearing.  HENT:     Head: Normocephalic and atraumatic.     Right Ear: Ear canal normal. Tympanic membrane is not perforated, erythematous, retracted or bulging.     Left Ear: Ear canal normal. Tympanic membrane is not perforated, erythematous, retracted or bulging.     Ears:     Comments: No mastoid erythema/swellng/tenderness.     Nose:     Right Sinus: No maxillary sinus tenderness or frontal sinus tenderness.     Left Sinus: No maxillary sinus tenderness or frontal sinus tenderness.     Mouth/Throat:     Pharynx: Oropharynx is clear. Uvula midline. No oropharyngeal exudate or posterior oropharyngeal erythema.     Comments: Posterior oropharynx is symmetric appearing. Patient tolerating own secretions without difficulty. No trismus. No drooling. No hot potato voice. No swelling beneath the tongue, submandibular compartment is soft.  Eyes:     General:        Right eye: No discharge.        Left  eye: No discharge.     Conjunctiva/sclera: Conjunctivae normal.  Cardiovascular:     Rate and Rhythm: Normal rate and regular rhythm.  Pulmonary:     Effort: No respiratory distress.     Breath sounds: Wheezing (diffuse with somewhat poor air movement) present. No rhonchi or rales.  Abdominal:     General: There is no distension.     Palpations: Abdomen is soft.     Tenderness: There is no abdominal tenderness.  Musculoskeletal:     Cervical back: Neck supple. No rigidity.     Right lower leg: No edema.     Left lower leg: No edema.  Lymphadenopathy:     Cervical: No cervical adenopathy.   Skin:    General: Skin is warm and dry.     Findings: No rash.  Neurological:     Mental Status: He is alert.  Psychiatric:        Behavior: Behavior normal.     ED Results / Procedures / Treatments   Labs (all labs ordered are listed, but only abnormal results are displayed) Labs Reviewed  POC SARS CORONAVIRUS 2 AG -  ED    EKG None  Radiology DG Chest Portable 1 View  Result Date: 07/02/2020 CLINICAL DATA:  Pt complains of coughing, shortness of breath over the past couple of days. 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 EXAM: PORTABLE CHEST 1 VIEW COMPARISON:  01/13/2004 FINDINGS: Normal heart, mediastinum and hila. Clear lungs.  No pleural effusion or pneumothorax. Skeletal structures are grossly unremarkable. IMPRESSION: No active disease. Electronically Signed   By: Amie Portland M.D.   On: 07/02/2020 11:04    Procedures Procedures   Medications Ordered in ED Medications - No data to display  ED Course  I have reviewed the triage vital signs and the nursing notes.  Pertinent labs & imaging results that were available during my care of the patient were reviewed by me and considered in my medical decision making (see chart for details).  William Marsh was evaluated in Emergency Department on 07/02/2020 for the symptoms described in the history of present illness. He/she was evaluated in the context of the global COVID-19 pandemic, which necessitated consideration that the patient might be at risk for infection with the SARS-CoV-2 virus that causes COVID-19. Institutional protocols and algorithms that pertain to the evaluation of patients at risk for COVID-19 are in a state of rapid change based on information released by regulatory bodies including the CDC and federal and state organizations. These policies and algorithms were followed during the patient's care in the ED.    MDM Rules/Calculators/A&P                          Patient presents to the ED with complaints of respiratory sxs x 1 week.  Nontoxic, vitals without significant abnormality.  Diffuse wheezing on exam.   Additional history obtained:  Additional history obtained from chart review & nursing note review.   Lab Tests:  I Ordered, reviewed, and interpreted labs, which included:  COVID test: Negative.   Imaging Studies ordered:  I ordered imaging studies which included CXR, I independently reviewed, formal radiology impression shows: No active disease  ED Course:  Albuterol inhaler given, remains with wheezing, duoneb ordered.   On re-assessment lungs CTA, no respiratory distress, patient requesting discharge.   No pneumonia, pulmonary edema/CHF, pneumothorax or other acute process  on CXR. Rapid covid negative. PERC negative. Will tx  W/ steroids and send home with inhaler given in the ED. I discussed results, treatment plan, need for follow-up, and return precautions with the patient. Provided opportunity for questions, patient confirmed understanding and is in agreement with plan.   Portions of this note were generated with Scientist, clinical (histocompatibility and immunogenetics). Dictation errors may occur despite best attempts at proofreading.  Final Clinical Impression(s) / ED Diagnoses Final diagnoses:  Wheezing    Rx / DC Orders ED Discharge Orders         Ordered    predniSONE (DELTASONE) 50 MG tablet  Daily with breakfast        07/02/20 1342           Maryalyce Sanjuan, [place] R, PA-C 07/02/20 1357    Rozelle Logan, DO 07/04/20 2347

## 2020-07-02 NOTE — ED Triage Notes (Signed)
Pt complains of coughing, shortness of breath over the past couple of days. 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 He has tried zyrtec, which has helped somewhat.

## 2020-08-13 ENCOUNTER — Encounter (HOSPITAL_COMMUNITY): Payer: Self-pay

## 2020-08-13 ENCOUNTER — Other Ambulatory Visit: Payer: Self-pay

## 2020-08-13 ENCOUNTER — Emergency Department (HOSPITAL_COMMUNITY)
Admission: EM | Admit: 2020-08-13 | Discharge: 2020-08-13 | Disposition: A | Discharge status: Home / Self Care | Payer: Self-pay | Attending: Emergency Medicine | Admitting: Emergency Medicine

## 2020-08-13 DIAGNOSIS — M5432 Sciatica, left side: Secondary | ICD-10-CM

## 2020-08-13 DIAGNOSIS — F17200 Nicotine dependence, unspecified, uncomplicated: Secondary | ICD-10-CM | POA: Insufficient documentation

## 2020-08-13 MED ORDER — NAPROXEN 500 MG PO TABS
500.0000 mg | ORAL_TABLET | Freq: Once | ORAL | Status: AC
  Administered 2020-08-13: 500 mg via ORAL
  Filled 2020-08-13: qty 1

## 2020-08-13 MED ORDER — ORPHENADRINE CITRATE ER 100 MG PO TB12: 100.0000 mg | ORAL_TABLET | Freq: Two times a day (BID) | ORAL | 0 refills | Status: DC

## 2020-08-13 MED ORDER — PREDNISONE 50 MG PO TABS: 50.0000 mg | ORAL_TABLET | Freq: Every day | ORAL | 0 refills | Status: DC

## 2020-08-13 MED ORDER — NAPROXEN 500 MG PO TABS: 500.0000 mg | ORAL_TABLET | Freq: Two times a day (BID) | ORAL | 0 refills | Status: DC

## 2020-08-13 MED ORDER — PREDNISONE 20 MG PO TABS
60.0000 mg | ORAL_TABLET | Freq: Once | ORAL | Status: AC
  Administered 2020-08-13: 60 mg via ORAL
  Filled 2020-08-13: qty 3

## 2020-08-13 MED ORDER — CYCLOBENZAPRINE HCL 10 MG PO TABS
10.0000 mg | ORAL_TABLET | Freq: Once | ORAL | Status: AC
  Administered 2020-08-13: 10 mg via ORAL
  Filled 2020-08-13: qty 1

## 2020-08-13 NOTE — Discharge Instructions (Addendum)
Apply ice for 30 minutes at a time, 4 times a day.  Ice should be applied to the left side of the lower back.  Take acetaminophen as needed for additional pain relief.

## 2020-08-13 NOTE — ED Triage Notes (Signed)
Pt c/o L leg pain radiating into hip and back. 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

## 2020-08-13 NOTE — ED Notes (Signed)
Patient complains of back pain. Patient left work due to the back pain. Medications were given.

## 2020-08-13 NOTE — ED Provider Notes (Signed)
[facility]-EMERGENCY DEPT Provider Note   CSN: [id number] Arrival date & time: 08/13/20  0549     History Chief Complaint  Patient presents with  . Leg Pain    William Marsh is a 35 y.o. male.  The history is provided by the patient.  Leg Pain He states that he hurt his back doing some lifting.  Injury occurred about 12 days ago.  He thought he would be able to work forward, but he continues to have pain in the left side of the lower back.  Pain is now radiating to the left hip and into the left thigh.  Pain is worse with standing for periods of time and with getting in certain positions.  Nothing seems to make it better.  He has tried applying ice to his left hip, but without benefit.  He has not taken any medications.  Pain is rated at 10/10.  He denies weakness, numbness, tingling.  He denies bowel or bladder dysfunction.  He denies prior similar symptoms.   History reviewed. No pertinent past medical history.  There are no problems to display for this patient.   History reviewed. No pertinent surgical history.     History reviewed. No pertinent family history.  Social History   Tobacco Use  . Smoking status: Current Some Day Smoker  . Smokeless tobacco: Never Used  Substance Use Topics  . Alcohol use: Yes  . Drug use: No    Home Medications Prior to Admission medications   Medication Sig Start Date End Date Taking? Authorizing Provider  predniSONE (DELTASONE) 50 MG tablet Take 1 tablet (50 mg total) by mouth daily with breakfast. 07/02/20   Petrucelli, Lelon Mast R, PA-C    Allergies    Penicillins  Review of Systems   Review of Systems  All other systems reviewed and are negative.   Physical Exam Updated Vital Signs BP (!) 134/91   Pulse 84   Temp 97.9 F (36.6 C) (Oral)   Resp 18   Ht 6' (1.829 m)   Wt 90.7 kg   SpO2 98%   BMI 27.12 kg/m   Physical Exam Vitals and nursing note reviewed.   35 year old male, resting  comfortably and in no acute distress. Vital signs are significant for mildly elevated blood pressure. Oxygen saturation is 98%, which is normal. Head is normocephalic and atraumatic. PERRLA, EOMI. Oropharynx is clear. Neck is nontender and supple without adenopathy or JVD. Back is nontender in the midline.  There is mild left paralumbar spasm.  There is mild tenderness palpation in the lower left paralumbar area.  Straight leg raise is positive on the left at 45 degrees, crossed straight leg raise is positive on the right at 60 degrees.  There is no CVA tenderness. Lungs are clear without rales, wheezes, or rhonchi. Chest is nontender. Heart has regular rate and rhythm without murmur. Abdomen is soft, flat, nontender without masses or hepatosplenomegaly and peristalsis is normoactive. Extremities have no cyanosis or edema, full range of motion is present.  With the left knee flexed, there is full passive range of motion of the left hip without pain. Skin is warm and dry without rash. Neurologic: Mental status is normal, cranial nerves are intact, there are no motor or sensory deficits.  ED Results / Procedures / Treatments    Procedures Procedures   Medications Ordered in ED Medications  naproxen (NAPROSYN) tablet 500 mg (has no administration in time range)  cyclobenzaprine (FLEXERIL) tablet 10  mg (has no administration in time range)  predniSONE (DELTASONE) tablet 60 mg (has no administration in time range)    ED Course  I have reviewed the triage vital signs and the nursing notes.  MDM Rules/Calculators/A&P                         Left-sided lower back and leg pain consistent with sciatica.  No red flags to suggest more serious pathology.  Old records are reviewed, and he has no relevant past visits.  No indication for imaging today.  He is discharged with prescription for naproxen and orphenadrine.  Also given a steroid burst.  Recommended he continue applying ice, take acetaminophen  as needed for additional pain relief.  He is referred to orthopedics for follow-up.  He has stated that he is filing for Workmen's Comp.  Advised that if he does follow Workmen's Comp., that his employer may dictate who he sees for follow-up.  Final Clinical Impression(s) / ED Diagnoses Final diagnoses:  Sciatica, left side    Rx / DC Orders ED Discharge Orders         Ordered    naproxen (NAPROSYN) 500 MG tablet  2 times daily        08/13/20 0618    orphenadrine (NORFLEX) 100 MG tablet  2 times daily        08/13/20 0618    predniSONE (DELTASONE) 50 MG tablet  Daily with breakfast        08/13/20 0618           Dione Booze, MD 08/13/20 [phone]

## 2020-10-22 ENCOUNTER — Emergency Department (HOSPITAL_COMMUNITY)
Admission: EM | Admit: 2020-10-22 | Discharge: 2020-10-22 | Disposition: A | Discharge status: Home / Self Care | Payer: Self-pay | Attending: Emergency Medicine | Admitting: Emergency Medicine

## 2020-10-22 ENCOUNTER — Other Ambulatory Visit: Payer: Self-pay

## 2020-10-22 ENCOUNTER — Encounter (HOSPITAL_COMMUNITY): Payer: Self-pay

## 2020-10-22 DIAGNOSIS — F17200 Nicotine dependence, unspecified, uncomplicated: Secondary | ICD-10-CM | POA: Insufficient documentation

## 2020-10-22 DIAGNOSIS — K0381 Cracked tooth: Secondary | ICD-10-CM | POA: Insufficient documentation

## 2020-10-22 DIAGNOSIS — K029 Dental caries, unspecified: Secondary | ICD-10-CM | POA: Insufficient documentation

## 2020-10-22 MED ORDER — AMOXICILLIN 500 MG PO CAPS: 500.0000 mg | ORAL_CAPSULE | Freq: Three times a day (TID) | ORAL | 0 refills | Status: DC

## 2020-10-22 MED ORDER — IBUPROFEN 600 MG PO TABS: 600.0000 mg | ORAL_TABLET | Freq: Four times a day (QID) | ORAL | 0 refills | Status: AC | PRN

## 2020-10-22 MED ORDER — AMOXICILLIN 500 MG PO CAPS
500.0000 mg | ORAL_CAPSULE | Freq: Once | ORAL | Status: AC
  Administered 2020-10-22: 500 mg via ORAL
  Filled 2020-10-22: qty 1

## 2020-10-22 NOTE — ED Provider Notes (Signed)
[facility]-EMERGENCY DEPT Provider Note   CSN: [id number] Arrival date & time: 10/22/20  5681     History Chief Complaint  Patient presents with   Dental Pain    William Marsh is a 35 y.o. male.  Pt presents to the ED today with dental pain.  Pt has a cracked upper right molar which intermittently causes him pain.  He has a new job and has been trying to wait until his dental insurance kicked in.  However, that is not until November and he can't wait that long.  No f/c.  No swelling.      History reviewed. No pertinent past medical history.  There are no problems to display for this patient.   History reviewed. No pertinent surgical history.     History reviewed. No pertinent family history.  Social History   Tobacco Use   Smoking status: Some Days   Smokeless tobacco: Never  Substance Use Topics   Alcohol use: Yes   Drug use: No    Home Medications Prior to Admission medications   Medication Sig Start Date End Date Taking? Authorizing Provider  amoxicillin (AMOXIL) 500 MG capsule Take 1 capsule (500 mg total) by mouth 3 (three) times daily. 10/22/20  Yes Jacalyn Lefevre, MD  ibuprofen (ADVIL) 600 MG tablet Take 1 tablet (600 mg total) by mouth every 6 (six) hours as needed. 10/22/20  Yes Jacalyn Lefevre, MD  naproxen (NAPROSYN) 500 MG tablet Take 1 tablet (500 mg total) by mouth 2 (two) times daily. 08/13/20   Dione Booze, MD  orphenadrine (NORFLEX) 100 MG tablet Take 1 tablet (100 mg total) by mouth 2 (two) times daily. 08/13/20   Dione Booze, MD  predniSONE (DELTASONE) 50 MG tablet Take 1 tablet (50 mg total) by mouth daily with breakfast. 08/13/20   Dione Booze, MD    Allergies    Penicillins  Review of Systems   Review of Systems  HENT:  Positive for dental problem.   All other systems reviewed and are negative.  Physical Exam Updated Vital Signs BP (!) 145/102   Pulse 82   Temp 97.9 F (36.6 C) (Oral)   Resp 16   SpO2 99%    Physical Exam Vitals and nursing note reviewed.  Constitutional:      Appearance: Normal appearance.  HENT:     Head: Normocephalic and atraumatic.     Right Ear: External ear normal.     Left Ear: External ear normal.     Nose: Nose normal.     Mouth/Throat:     Mouth: Mucous membranes are moist.     Dentition: Dental caries present.      Comments: Cracked tooth.  No abscess Eyes:     Extraocular Movements: Extraocular movements intact.     Conjunctiva/sclera: Conjunctivae normal.     Pupils: Pupils are equal, round, and reactive to light.  Cardiovascular:     Rate and Rhythm: Normal rate and regular rhythm.     Pulses: Normal pulses.     Heart sounds: Normal heart sounds.  Pulmonary:     Effort: Pulmonary effort is normal.     Breath sounds: Normal breath sounds.  Abdominal:     General: Abdomen is flat. Bowel sounds are normal.     Palpations: Abdomen is soft.  Musculoskeletal:        General: Normal range of motion.     Cervical back: Normal range of motion and neck supple.  Skin:  General: Skin is warm.     Capillary Refill: Capillary refill takes less than 2 seconds.  Neurological:     General: No focal deficit present.     Mental Status: He is alert and oriented to person, place, and time.  Psychiatric:        Mood and Affect: Mood normal.        Behavior: Behavior normal.        Thought Content: Thought content normal.        Judgment: Judgment normal.    ED Results / Procedures / Treatments   Labs (all labs ordered are listed, but only abnormal results are displayed) Labs Reviewed - No data to display  EKG None  Radiology No results found.  Procedures Procedures   Medications Ordered in ED Medications  amoxicillin (AMOXIL) capsule 500 mg (has no administration in time range)    ED Course  I have reviewed the triage vital signs and the nursing notes.  Pertinent labs & imaging results that were available during my care of the patient were  reviewed by me and considered in my medical decision making (see chart for details).    MDM Rules/Calculators/A&P                           Pt started on amox and advil.  He is given the number of the dentist on call.  He is to return if worse.  Final Clinical Impression(s) / ED Diagnoses Final diagnoses:  Dental caries    Rx / DC Orders ED Discharge Orders          Ordered    amoxicillin (AMOXIL) 500 MG capsule  3 times daily        10/22/20 0727    ibuprofen (ADVIL) 600 MG tablet  Every 6 hours PRN        10/22/20 0727             Jacalyn Lefevre, MD 10/22/20 0730

## 2020-10-22 NOTE — ED Triage Notes (Signed)
Pt complains of right sided toothache x 1 day. Pt has a broken tooth (upper right back).

## 2020-12-31 ENCOUNTER — Other Ambulatory Visit: Payer: Self-pay

## 2020-12-31 ENCOUNTER — Encounter (HOSPITAL_COMMUNITY): Payer: Self-pay

## 2020-12-31 ENCOUNTER — Emergency Department (HOSPITAL_COMMUNITY)
Admission: EM | Admit: 2020-12-31 | Discharge: 2020-12-31 | Disposition: A | Discharge status: Home / Self Care | Payer: Self-pay | Attending: Emergency Medicine | Admitting: Emergency Medicine

## 2020-12-31 DIAGNOSIS — F17200 Nicotine dependence, unspecified, uncomplicated: Secondary | ICD-10-CM | POA: Insufficient documentation

## 2020-12-31 DIAGNOSIS — B349 Viral infection, unspecified: Secondary | ICD-10-CM | POA: Insufficient documentation

## 2020-12-31 DIAGNOSIS — J029 Acute pharyngitis, unspecified: Secondary | ICD-10-CM

## 2020-12-31 LAB — GROUP A STREP BY PCR: Group A Strep by PCR: NOT DETECTED

## 2020-12-31 NOTE — ED Provider Notes (Signed)
[facility]-EMERGENCY DEPT Provider Note   CSN: [id number] Arrival date & time: 12/31/20  0731     History Chief Complaint  Patient presents with   Sore Throat    William Marsh is a 35 y.o. male presenting to the ED with sore throat, ongoing about 5 days.  He reports prior to that he had congestion, cough, headache, and malaise for about 5 days.  No sig medical hx.  Allergies to PCN.  Hx of strep throat back in 2008.  He reports pain with swallowing, no fevers.  HPI     History reviewed. No pertinent past medical history.  There are no problems to display for this patient.   Past Surgical History:  Procedure Laterality Date   HERNIA REPAIR         Family History  Family history unknown: Yes    Social History   Tobacco Use   Smoking status: Some Days   Smokeless tobacco: Never  Vaping Use   Vaping Use: Never used  Substance Use Topics   Alcohol use: Yes   Drug use: No    Home Medications Prior to Admission medications   Medication Sig Start Date End Date Taking? Authorizing Provider  amoxicillin (AMOXIL) 500 MG capsule Take 1 capsule (500 mg total) by mouth 3 (three) times daily. 10/22/20   Jacalyn Lefevre, MD  ibuprofen (ADVIL) 600 MG tablet Take 1 tablet (600 mg total) by mouth every 6 (six) hours as needed. 10/22/20   Jacalyn Lefevre, MD  naproxen (NAPROSYN) 500 MG tablet Take 1 tablet (500 mg total) by mouth 2 (two) times daily. 08/13/20   Dione Booze, MD  orphenadrine (NORFLEX) 100 MG tablet Take 1 tablet (100 mg total) by mouth 2 (two) times daily. 08/13/20   Dione Booze, MD  predniSONE (DELTASONE) 50 MG tablet Take 1 tablet (50 mg total) by mouth daily with breakfast. 08/13/20   Dione Booze, MD    Allergies    Penicillins  Review of Systems   Review of Systems  Constitutional:  Negative for chills and fever.  HENT:  Positive for congestion, sore throat and trouble swallowing. Negative for voice change.   Eyes:  Negative for  photophobia and visual disturbance.  Respiratory:  Negative for cough and shortness of breath.   Cardiovascular:  Negative for chest pain and palpitations.  Gastrointestinal:  Negative for abdominal pain and vomiting.  Musculoskeletal:  Negative for arthralgias and back pain.  Skin:  Negative for color change and rash.  Neurological:  Positive for headaches. Negative for syncope.  All other systems reviewed and are negative.  Physical Exam Updated Vital Signs BP 133/83 (BP Location: Right Arm)   Pulse 80   Temp 99.3 F (37.4 C) (Oral)   Resp 16   Ht 6' (1.829 m)   Wt 88.5 kg   SpO2 98%   BMI 26.45 kg/m   Physical Exam Constitutional:      General: He is not in acute distress. HENT:     Head: Normocephalic and atraumatic.     Mouth/Throat:     Lips: Pink.     Mouth: Mucous membranes are moist.     Pharynx: Uvula midline. No pharyngeal swelling or oropharyngeal exudate.     Tonsils: No tonsillar exudate or tonsillar abscesses.     Comments: +Cervical and submandibular tender lymphadenopathy, minor Eyes:     Conjunctiva/sclera: Conjunctivae normal.     Pupils: Pupils are equal, round, and reactive to light.  Cardiovascular:  Rate and Rhythm: Normal rate and regular rhythm.  Pulmonary:     Effort: Pulmonary effort is normal. No respiratory distress.  Abdominal:     General: There is no distension.     Tenderness: There is no abdominal tenderness.  Skin:    General: Skin is warm and dry.  Neurological:     General: No focal deficit present.     Mental Status: He is alert. Mental status is at baseline.  Psychiatric:        Mood and Affect: Mood normal.        Behavior: Behavior normal.    ED Results / Procedures / Treatments   Labs (all labs ordered are listed, but only abnormal results are displayed) Labs Reviewed  GROUP A STREP BY PCR    EKG None  Radiology No results found.  Procedures Procedures   Medications Ordered in ED Medications - No data  to display  ED Course  I have reviewed the triage vital signs and the nursing notes.  Pertinent labs & imaging results that were available during my care of the patient were reviewed by me and considered in my medical decision making (see chart for details).  Patient is here with suspected viral syndrome for strep throat.  Will check rapid strep - if negative anticipate discharge home.   Strep negative.  No evidence of deep space neck infection, Ludwig angina, sepsis, or trismus.  The patient is otherwise clinically well-appearing.  Clinical Course as of 12/31/20 1025  Wed Dec 31, 2020  0901 Strep negative, likely viral syndrome, okay for discharge [MT]    Clinical Course User Index [MT] Elouise Divelbiss, Kermit Balo, MD    Final Clinical Impression(s) / ED Diagnoses Final diagnoses:  Sore throat  Viral illness    Rx / DC Orders ED Discharge Orders     None        Terald Sleeper, MD 12/31/20 1025

## 2020-12-31 NOTE — Discharge Instructions (Addendum)
Your strep throat test was negative.  Keep taking over-the-counter medications like ibuprofen and tylenol as needed for sore throat and viral symptoms.  You should start feeling better in the next 3-7 days.

## 2020-12-31 NOTE — ED Triage Notes (Signed)
Patient c/o sore throat and chills x 4-5 days

## 2021-06-18 ENCOUNTER — Encounter (HOSPITAL_COMMUNITY): Payer: Self-pay

## 2021-06-18 ENCOUNTER — Other Ambulatory Visit: Payer: Self-pay

## 2021-06-18 ENCOUNTER — Emergency Department (HOSPITAL_COMMUNITY)
Admission: EM | Admit: 2021-06-18 | Discharge: 2021-06-18 | Disposition: A | Discharge status: Home / Self Care | Payer: Managed Care, Other (non HMO) | Attending: Emergency Medicine | Admitting: Emergency Medicine

## 2021-06-18 ENCOUNTER — Emergency Department (HOSPITAL_COMMUNITY): Payer: Managed Care, Other (non HMO)

## 2021-06-18 DIAGNOSIS — H5789 Other specified disorders of eye and adnexa: Secondary | ICD-10-CM | POA: Diagnosis not present

## 2021-06-18 DIAGNOSIS — J3489 Other specified disorders of nose and nasal sinuses: Secondary | ICD-10-CM | POA: Insufficient documentation

## 2021-06-18 DIAGNOSIS — R062 Wheezing: Secondary | ICD-10-CM | POA: Insufficient documentation

## 2021-06-18 DIAGNOSIS — J302 Other seasonal allergic rhinitis: Secondary | ICD-10-CM | POA: Diagnosis not present

## 2021-06-18 DIAGNOSIS — R0602 Shortness of breath: Secondary | ICD-10-CM | POA: Diagnosis present

## 2021-06-18 LAB — CBC WITH DIFFERENTIAL/PLATELET
Abs Immature Granulocytes: 0.02 10*3/uL (ref 0.00–0.07)
Basophils Absolute: 0.1 10*3/uL (ref 0.0–0.1)
Basophils Relative: 1 %
Eosinophils Absolute: 0.7 10*3/uL — ABNORMAL HIGH (ref 0.0–0.5)
Eosinophils Relative: 9 %
HCT: 47.3 % (ref 39.0–52.0)
Hemoglobin: 15.6 g/dL (ref 13.0–17.0)
Immature Granulocytes: 0 %
Lymphocytes Relative: 16 %
Lymphs Abs: 1.3 10*3/uL (ref 0.7–4.0)
MCH: 29.2 pg (ref 26.0–34.0)
MCHC: 33 g/dL (ref 30.0–36.0)
MCV: 88.4 fL (ref 80.0–100.0)
Monocytes Absolute: 0.6 10*3/uL (ref 0.1–1.0)
Monocytes Relative: 7 %
Neutro Abs: 5.5 10*3/uL (ref 1.7–7.7)
Neutrophils Relative %: 67 %
Platelets: 172 10*3/uL (ref 150–400)
RBC: 5.35 MIL/uL (ref 4.22–5.81)
RDW: 14.1 % (ref 11.5–15.5)
WBC: 8.2 10*3/uL (ref 4.0–10.5)
nRBC: 0 % (ref 0.0–0.2)

## 2021-06-18 LAB — BASIC METABOLIC PANEL
Anion gap: 7 (ref 5–15)
BUN: 10 mg/dL (ref 6–20)
CO2: 25 mmol/L (ref 22–32)
Calcium: 9.2 mg/dL (ref 8.9–10.3)
Chloride: 102 mmol/L (ref 98–111)
Creatinine, Ser: 0.8 mg/dL (ref 0.61–1.24)
GFR, Estimated: >60 mL/min (ref >60)
Glucose, Bld: 86 mg/dL (ref 70–99)
Potassium: 4 mmol/L (ref 3.5–5.1)
Sodium: 134 mmol/L — ABNORMAL LOW (ref 135–145)

## 2021-06-18 MED ORDER — ALBUTEROL SULFATE HFA 108 (90 BASE) MCG/ACT IN AERS: 1.0000 | INHALATION_SPRAY | Freq: Four times a day (QID) | RESPIRATORY_TRACT | 0 refills | Status: DC | PRN

## 2021-06-18 MED ORDER — IPRATROPIUM-ALBUTEROL 0.5-2.5 (3) MG/3ML IN SOLN
3.0000 mL | Freq: Once | RESPIRATORY_TRACT | Status: AC
  Administered 2021-06-18: 3 mL via RESPIRATORY_TRACT
  Filled 2021-06-18: qty 3

## 2021-06-18 MED ORDER — PREDNISONE 20 MG PO TABS
40.0000 mg | ORAL_TABLET | Freq: Once | ORAL | Status: AC
  Administered 2021-06-18: 40 mg via ORAL
  Filled 2021-06-18: qty 2

## 2021-06-18 MED ORDER — PREDNISONE 20 MG PO TABS: 40.0000 mg | ORAL_TABLET | Freq: Every day | ORAL | 0 refills | Status: AC

## 2021-06-18 MED ORDER — BENZONATATE 100 MG PO CAPS: 100.0000 mg | ORAL_CAPSULE | Freq: Two times a day (BID) | ORAL | 0 refills | Status: DC | PRN

## 2021-06-18 NOTE — ED Triage Notes (Signed)
Patient presents to ED with c/o dyspnea x 3 days. States he has bad seasonal allergies and was given an inhaler, states he used it with no relief. Reports being unable to sleep due to cough and difficulty breathing.  ?

## 2021-06-18 NOTE — Discharge Instructions (Addendum)
Return to ED with any new or worsening signs or symptoms such as leg swelling, chest pain, return of wheezing or shortness of breath ?Please pick up an antihistamine such as Zyrtec, Claritin at your local pharmacy ?Please pick up steroids vaccinated for you.  You will take 40 mg for the next 4 days ?I referred you to a PCP.  Please call to make an appointment ?Please read the attached informational guide concerning allergies and ways to alleviate them at home ?Please call the PCP I have referred you to make an appointment to be seen ?

## 2021-06-18 NOTE — ED Provider Notes (Signed)
?[facility]-EMERGENCY DEPT ?Provider Note ? ? ?CSN: [id number] ?Arrival date & time: 06/18/21  [id number] ? ?  ? ?History ? ?Chief Complaint  ?Patient presents with  ? Shortness of Breath  ? ? ?William Marsh is a 36 y.o. male with medical history significant for seasonal allergies.  Patient presents to ED for evaluation of shortness of breath.  Patient states that for the last 3 days he has been experiencing shortness of breath consistently throughout the day.  Patient states that this happened last year around the same time and he attributed it to seasonal allergies and pollen.  Patient states that last year at this time, he presented to ED and was given albuterol inhaler.  Patient states that he has been attempting to use his albuterol inhaler over the last 3 days without relief of symptoms.  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  Patient continues to he has been experiencing a cough that awakens him from his sleep, wheezing, shortness of breath, fevers, vomiting due to cough x3, congestion, rhinorrhea, itchy eyes.  Patient denies any chest pain, leg swelling, abdominal pain, diarrhea, sore throat, any known sick contacts. ? ? ?Shortness of Breath ?Associated symptoms: cough, fever, vomiting and wheezing   ?Associated symptoms: no abdominal pain, no chest pain and no sore throat   ? ?  ? ?Home Medications ?Prior to Admission medications   ?Medication Sig Start Date End Date Taking? Authorizing Provider  ?albuterol (VENTOLIN HFA) 108 (90 Base) MCG/ACT inhaler Inhale 1-2 puffs into the lungs every 6 (six) hours as needed for wheezing or shortness of breath. 06/18/21  Yes Al DecantGroce, Bowie Delia F, PA-C  ?benzonatate (TESSALON) 100 MG capsule Take 1 capsule (100 mg total) by mouth 2 (two) times daily as needed for cough. 06/18/21  Yes Al DecantGroce, Langford Carias F, PA-C  ?predniSONE (DELTASONE) 20 MG tablet Take 2 tablets (40 mg total) by mouth daily for 4 days.  06/18/21 06/22/21 Yes Al DecantGroce, Derrien Anschutz F, PA-C  ?amoxicillin (AMOXIL) 500 MG capsule Take 1 capsule (500 mg total) by mouth 3 (three) times daily. 10/22/20   Jacalyn LefevreHaviland, Julie, MD  ?ibuprofen (ADVIL) 600 MG tablet Take 1 tablet (600 mg total) by mouth every 6 (six) hours as needed. 10/22/20   Jacalyn LefevreHaviland, Julie, MD  ?naproxen (NAPROSYN) 500 MG tablet Take 1 tablet (500 mg total) by mouth 2 (two) times daily. 08/13/20   Dione BoozeGlick, David, MD  ?orphenadrine (NORFLEX) 100 MG tablet Take 1 tablet (100 mg total) by mouth 2 (two) times daily. 08/13/20   Dione BoozeGlick, David, MD  ?   ? ?Allergies    ?Penicillins   ? ?Review of Systems   ?Review of Systems  ?Constitutional:  Positive for chills and fever.  ?HENT:  Positive for congestion and rhinorrhea. Negative for sore throat.   ?Respiratory:  Positive for cough, shortness of breath and wheezing.   ?Cardiovascular:  Negative for chest pain and leg swelling.  ?Gastrointestinal:  Positive for nausea and vomiting. Negative for abdominal pain and diarrhea.  ?All other systems reviewed and are negative. ? ?Physical Exam ?Updated Vital Signs ?BP (!) 136/95   Pulse 74   Temp 98.4 ?F (36.9 ?C) (Oral)   Resp 17   Ht 6' (1.829 m)   Wt 93 kg   SpO2 100%   BMI 27.80 kg/m?  ?Physical Exam ?Vitals and nursing note reviewed.  ?Constitutional:   ?   General: He is not in acute distress. ?  Appearance: He is not ill-appearing, toxic-appearing or diaphoretic.  ?HENT:  ?   Head: Normocephalic and atraumatic.  ?   Nose: Congestion and rhinorrhea present.  ?   Mouth/Throat:  ?   Mouth: Mucous membranes are moist.  ?   Pharynx: Oropharynx is clear. No pharyngeal swelling or oropharyngeal exudate.  ?Eyes:  ?   Extraocular Movements: Extraocular movements intact.  ?   Pupils: Pupils are equal, round, and reactive to light.  ?Neck:  ?   Vascular: No JVD.  ?Cardiovascular:  ?   Rate and Rhythm: Normal rate and regular rhythm.  ?Pulmonary:  ?   Effort: Pulmonary effort is normal. No accessory muscle usage or  respiratory distress.  ?   Breath sounds: Wheezing present. No decreased breath sounds, rhonchi or rales.  ?Chest:  ?   Chest wall: No deformity or tenderness.  ?Abdominal:  ?   General: Bowel sounds are normal.  ?   Palpations: Abdomen is soft.  ?   Tenderness: There is no abdominal tenderness.  ?Musculoskeletal:     ?   General: Normal range of motion.  ?   Cervical back: Normal range of motion and neck supple.  ?   Right lower leg: No tenderness. No edema.  ?   Left lower leg: No tenderness. No edema.  ?Lymphadenopathy:  ?   Cervical: No cervical adenopathy.  ?Skin: ?   General: Skin is warm and dry.  ?   Capillary Refill: Capillary refill takes less than 2 seconds.  ?Neurological:  ?   Mental Status: He is alert and oriented to person, place, and time.  ? ? ?ED Results / Procedures / Treatments   ?Labs ?(all labs ordered are listed, but only abnormal results are displayed) ?Labs Reviewed  ?CBC WITH DIFFERENTIAL/PLATELET - Abnormal; Notable for the following components:  ?    Result Value  ? Eosinophils Absolute 0.7 (*)   ? All other components within normal limits  ?BASIC METABOLIC PANEL - Abnormal; Notable for the following components:  ? Sodium 134 (*)   ? All other components within normal limits  ? ? ?EKG ?None ? ?Radiology ?DG Chest 2 View ? ?Result Date: 06/18/2021 ?CLINICAL DATA:  Dyspnea for 3 days EXAM: CHEST - 2 VIEW COMPARISON:  Chest radiograph 07/02/2020 FINDINGS: The cardiomediastinal silhouette is normal. There is no focal consolidation or pulmonary edema. There is no pleural effusion or pneumothorax. There is no acute osseous abnormality. IMPRESSION: No radiographic evidence of acute cardiopulmonary process. Electronically Signed   By: Lesia Hausen M.D.   On: 06/18/2021 07:57   ? ?Procedures ?Procedures  ? ?Medications Ordered in ED ?Medications  ?ipratropium-albuterol (DUONEB) 0.5-2.5 (3) MG/3ML nebulizer solution 3 mL (3 mLs Nebulization Given 06/18/21 0727)  ?predniSONE (DELTASONE) tablet 40 mg  (40 mg Oral Given 06/18/21 0727)  ?ipratropium-albuterol (DUONEB) 0.5-2.5 (3) MG/3ML nebulizer solution 3 mL (3 mLs Nebulization Given 06/18/21 0815)  ? ? ?ED Course/ Medical Decision Making/ A&P ?  ?                        ?Medical Decision Making ? ?36 year old male presents to ED for evaluation of shortness of breath.  Please see HPI for further details. ? ?On examination, the patient is afebrile.  The patient is nontachycardic with a pulse of 82 bpm.  Patient lung sounds have diffuse wheezing bilaterally in upper and middle lobes however no signs of respiratory distress, no tripoding, no accessory muscle usage.  Patient not hypoxic with 97% O2 saturation on room air.  Patient abdomen soft compressible in all 4 quadrants.  No lower extremity edema.  Patient posterior oropharynx shows no signs of exudate, erythema.  Patient uvula is midline and the patient is handling secretions appropriately.  No potato voice, no drooling, no trismus, no swollen tongue.  Patient denies any history of DVT/blood clot, cancer, hormonal use, unilateral leg swelling, hemoptysis.  Patient is PERC negative. ? ?Patient worked up utilizing the following labs and imaging studies interpreted by me personally: ?- Chest x-ray ?- CBC ?- BMP ? ?Patient provided with 40 mg prednisone, 1 round DuoNeb. ? ?Patient will be sent home on 40 mg prednisone for 4 days as well as provided with a new albuterol inhaler.   ? ?On chart review, it appears that this shortness of breath due to allergies is a known problem for the patient that has occurred multiple times now.  Because of this, patient has been referred to PCP as he states that he does not have one.  ? ?No social determinants affecting patient care were noted. ? ?On reassessment, patient still has slight wheezing bilaterally.  Patient requesting discharge at this time however.  I have advised the patient that I feel he would benefit from 1 more round of DuoNeb however he states that he would like to  go home.  I have provided him with return precautions and he voiced understanding.  I have answered all of his questions to his satisfaction.   ? ?I feel the patient is stable for discharge.  He has shown signif

## 2021-07-15 ENCOUNTER — Ambulatory Visit (HOSPITAL_BASED_OUTPATIENT_CLINIC_OR_DEPARTMENT_OTHER): Payer: Managed Care, Other (non HMO) | Admitting: Family Medicine

## 2021-10-15 ENCOUNTER — Encounter (HOSPITAL_COMMUNITY): Payer: Self-pay

## 2021-10-15 ENCOUNTER — Other Ambulatory Visit: Payer: Self-pay

## 2021-10-15 ENCOUNTER — Emergency Department (HOSPITAL_COMMUNITY)
Admission: EM | Admit: 2021-10-15 | Discharge: 2021-10-15 | Disposition: A | Discharge status: Home / Self Care | Payer: 59 | Attending: Emergency Medicine | Admitting: Emergency Medicine

## 2021-10-15 DIAGNOSIS — M5442 Lumbago with sciatica, left side: Secondary | ICD-10-CM

## 2021-10-15 DIAGNOSIS — M545 Low back pain, unspecified: Secondary | ICD-10-CM | POA: Diagnosis present

## 2021-10-15 DIAGNOSIS — R000 Tachycardia, unspecified: Secondary | ICD-10-CM | POA: Diagnosis not present

## 2021-10-15 MED ORDER — PREDNISONE 50 MG PO TABS: 50.0000 mg | ORAL_TABLET | Freq: Every day | ORAL | 0 refills | Status: DC

## 2021-10-15 MED ORDER — PREDNISONE 20 MG PO TABS
60.0000 mg | ORAL_TABLET | Freq: Once | ORAL | Status: AC
Start: 2021-10-15 — End: 2021-10-15
  Administered 2021-10-15: 60 mg via ORAL
  Filled 2021-10-15: qty 3

## 2021-10-15 MED ORDER — METHOCARBAMOL 500 MG PO TABS: 500.0000 mg | ORAL_TABLET | Freq: Three times a day (TID) | ORAL | 0 refills | Status: AC | PRN

## 2021-10-15 MED ORDER — LIDOCAINE 5 % EX PTCH
2.0000 | MEDICATED_PATCH | CUTANEOUS | Status: DC
  Administered 2021-10-15: 2 via TRANSDERMAL
  Filled 2021-10-15: qty 2

## 2021-10-15 MED ORDER — LIDOCAINE 5 % EX PTCH: 1.0000 | MEDICATED_PATCH | Freq: Every day | CUTANEOUS | 0 refills | Status: AC | PRN

## 2021-10-15 NOTE — ED Provider Notes (Signed)
[facility]-EMERGENCY DEPT Provider Note   CSN: [id number] Arrival date & time: 10/15/21  4259     History  Chief Complaint  Patient presents with   Back Pain    William Marsh is a 36 y.o. male without significant pmhx who presents to the ED with complaints of back pain since yesterday. Reports lower back pain radiating down the left lower extremity, subsided yesterday then returned today. Reports a lot of lifting at work, no specific injury. Pain worse with lifting, certain movements, and prolonged standing. Tried stretching, aleve, and advil with mild relief. Having some associated LLE paresthesias intermittently. Denies complete numbness, weakness, saddle anesthesia, incontinence to bowel/bladder, fever, chills, IV drug use, dysuria, or hx of cancer. Patient has not had prior back surgeries. Reports hx of sciatica 1 year prior- this feels similar.   HPI     Home Medications Prior to Admission medications   Medication Sig Start Date End Date Taking? Authorizing Provider  albuterol (VENTOLIN HFA) 108 (90 Base) MCG/ACT inhaler Inhale 1-2 puffs into the lungs every 6 (six) hours as needed for wheezing or shortness of breath. 06/18/21   Al Decant, PA-C  amoxicillin (AMOXIL) 500 MG capsule Take 1 capsule (500 mg total) by mouth 3 (three) times daily. 10/22/20   Jacalyn Lefevre, MD  benzonatate (TESSALON) 100 MG capsule Take 1 capsule (100 mg total) by mouth 2 (two) times daily as needed for cough. 06/18/21   Al Decant, PA-C  ibuprofen (ADVIL) 600 MG tablet Take 1 tablet (600 mg total) by mouth every 6 (six) hours as needed. 10/22/20   Jacalyn Lefevre, MD  naproxen (NAPROSYN) 500 MG tablet Take 1 tablet (500 mg total) by mouth 2 (two) times daily. 08/13/20   Dione Booze, MD  orphenadrine (NORFLEX) 100 MG tablet Take 1 tablet (100 mg total) by mouth 2 (two) times daily. 08/13/20   Dione Booze, MD      Allergies    Penicillins    Review of Systems    Review of Systems  Constitutional:  Negative for chills, fever and unexpected weight change.  Gastrointestinal:  Negative for abdominal pain, nausea and vomiting.  Genitourinary:  Negative for dysuria.  Musculoskeletal:  Positive for back pain.  Neurological:  Negative for weakness and numbness.       Negative for saddle anesthesia or bowel/bladder incontinence.   All other systems reviewed and are negative.  Physical Exam Updated Vital Signs BP (!) 153/111   Pulse (!) 112   Temp 98 F (36.7 C)   Resp 20   Ht 6' (1.829 m)   Wt 93 kg   SpO2 97%   BMI 27.81 kg/m  Physical Exam Vitals and nursing note reviewed.  Constitutional:      General: He is not in acute distress.    Appearance: He is well-developed. He is not toxic-appearing.  HENT:     Head: Normocephalic and atraumatic.  Eyes:     General:        Right eye: No discharge.        Left eye: No discharge.     Conjunctiva/sclera: Conjunctivae normal.  Cardiovascular:     Rate and Rhythm: Normal rate and regular rhythm.     Pulses:          Posterior tibial pulses are 2+ on the right side and 2+ on the left side.  Pulmonary:     Effort: No respiratory distress.     Breath sounds: Normal breath  sounds. No wheezing or rales.  Abdominal:     General: There is no distension.     Palpations: Abdomen is soft.     Tenderness: There is no abdominal tenderness.  Musculoskeletal:     Cervical back: Normal range of motion and neck supple. No spinous process tenderness or muscular tenderness.     Comments: No obvious deformity, appreciable swelling, erythema, ecchymosis, significant open wounds, or increased warmth.  Back: No point/focal vertebral tenderness, no palpable step off or crepitus.  Left lumbar paraspinal muscle tenderness to palpation. Lower extremities: ranging @ all major joints. No focal bony tenderness.   Skin:    General: Skin is warm and dry.     Findings: No rash.  Neurological:     Mental Status: He is  alert.     Comments: Sensation grossly intact to bilateral lower extremities. 5/5 symmetric strength with plantar/dorsiflexion bilaterally. Gait is intact without obvious foot drop.   Psychiatric:        Behavior: Behavior normal.     ED Results / Procedures / Treatments   Labs (all labs ordered are listed, but only abnormal results are displayed) Labs Reviewed - No data to display  EKG None  Radiology No results found.  Procedures Procedures    Medications Ordered in ED Medications - No data to display  ED Course/ Medical Decision Making/ A&P                           Medical Decision Making  Patient presents to the ED with complaints of  back pain.  Nontoxic, vitals w/ mild tachycardia- resolved on my exam and hypertension, doubt hypertensive emergency, discussed need for PCP recheck..   Additional history obtained:  Additional history obtained from chart review & nursing note review.   ED Course:  Patient has no back pain red flags.  No neurologic deficits, ambulatory- doubt cauda equina syndrome or acute cord compression. Afebrile, no hx of IVDU- doubt epidural abscess. No urinary sxs- feel that UTI/nephrolithiasis. Symmetric pulses, not a tearing sensation, overall well appearing, feel that dissection is unlikely at this time. Favor musculoskeletal pain- likely strain/spasm w/ component of sciatica. Will treat with Prednisone, lidoderm patch, and Robaxin, discussed with patient that they are not to drive or operate heavy machinery while taking Robaxin. I discussed treatment plan, need for PCP follow-up, and return precautions with the patient. Provided opportunity for questions, patient confirmed understanding and is in agreement with plan.   Portions of this note were generated with Scientist, clinical (histocompatibility and immunogenetics). Dictation errors may occur despite best attempts at proofreading.    Final Clinical Impression(s) / ED Diagnoses Final diagnoses:  Acute left-sided low back  pain with left-sided sciatica    Rx / DC Orders ED Discharge Orders          Ordered    predniSONE (DELTASONE) 50 MG tablet  Daily with breakfast        10/15/21 0441    methocarbamol (ROBAXIN) 500 MG tablet  Every 8 hours PRN        10/15/21 0441    lidocaine (LIDODERM) 5 %  Daily PRN        10/15/21 0441              Nam Vossler, Pleas Koch, PA-C 10/15/21 0456    Tilden Fossa, MD 10/15/21 [phone]

## 2021-10-15 NOTE — ED Triage Notes (Signed)
Pain radiating from left side lower back to left ankle.  Diagnosed with sciatica last year Denies urinary symptoms.

## 2021-10-15 NOTE — Discharge Instructions (Addendum)
You were seen in the emergency department for back pain today.  At this time we suspect that your pain is related to a muscle strain/spasm with a component of sciatica.  We are sending you home with the following medicines to help with your symptoms:  -Prednisone: This is a steroid to help with pain and inflammation, be sure to take this medication with food in the morning, start taking this Friday 07/28 as we gave your first dose in the emergency department this morning.  It should be taken with food, as it can cause stomach upset, and more seriously, stomach bleeding. Do not take other nonsteroidal anti-inflammatory medications with this such as Advil, Motrin, Aleve, Mobic, Goodie Powder, or Motrin etc..    - Robaxin- this is the muscle relaxer I have prescribed, this is meant to help with muscle tightness/spasms. Be aware that this medication may make you drowsy therefore the first time you take this it should be at a time you are in an environment where you can rest. Do not drive or operate heavy machinery when taking this medication. Do not drink alcohol or take other sedating medications with this medicine such as narcotics or benzodiazepines.   - Lidoderm patch- Apply 1 patch to your area of most significant pain once per day to help numb/soothe this area. Remove & discard patch within 12 hours of application.  Do not put heat over the patches as this can cause him to be systemically absorbed.  You make take Tylenol per over the counter dosing with these medications.   We have prescribed you new medication(s) today. Discuss the medications prescribed today with your pharmacist as they can have adverse effects and interactions with your other medicines including over the counter and prescribed medications. Seek medical evaluation if you start to experience new or abnormal symptoms after taking one of these medicines, seek care immediately if you start to experience difficulty breathing, feeling of  your throat closing, facial swelling, or rash as these could be indications of a more serious allergic reaction  Please follow-up with primary care within 1 week for reevaluation.  Return to the ER for any new or worsening symptoms including but not limited to new or worsening pain, fever, numbness/weakness, loss of control bowel or bladder function, inability to walk, or any other concerns.   Staff your blood pressure was elevated in the emergency department today, please have this rechecked by your primary care provider within 2 weeks.

## 2022-02-05 IMAGING — DX DG CHEST 1V PORT
1 series · 1 of 1 positions shown · non-contrast
Comparison: 01/13/2004

CLINICAL DATA: Pt complains of coughing, shortness of breath over
the past couple of days. He said he is allergic to pollen and works
in a warehouse with a lot of pollen.

EXAM:
PORTABLE CHEST 1 VIEW

[chest ap]
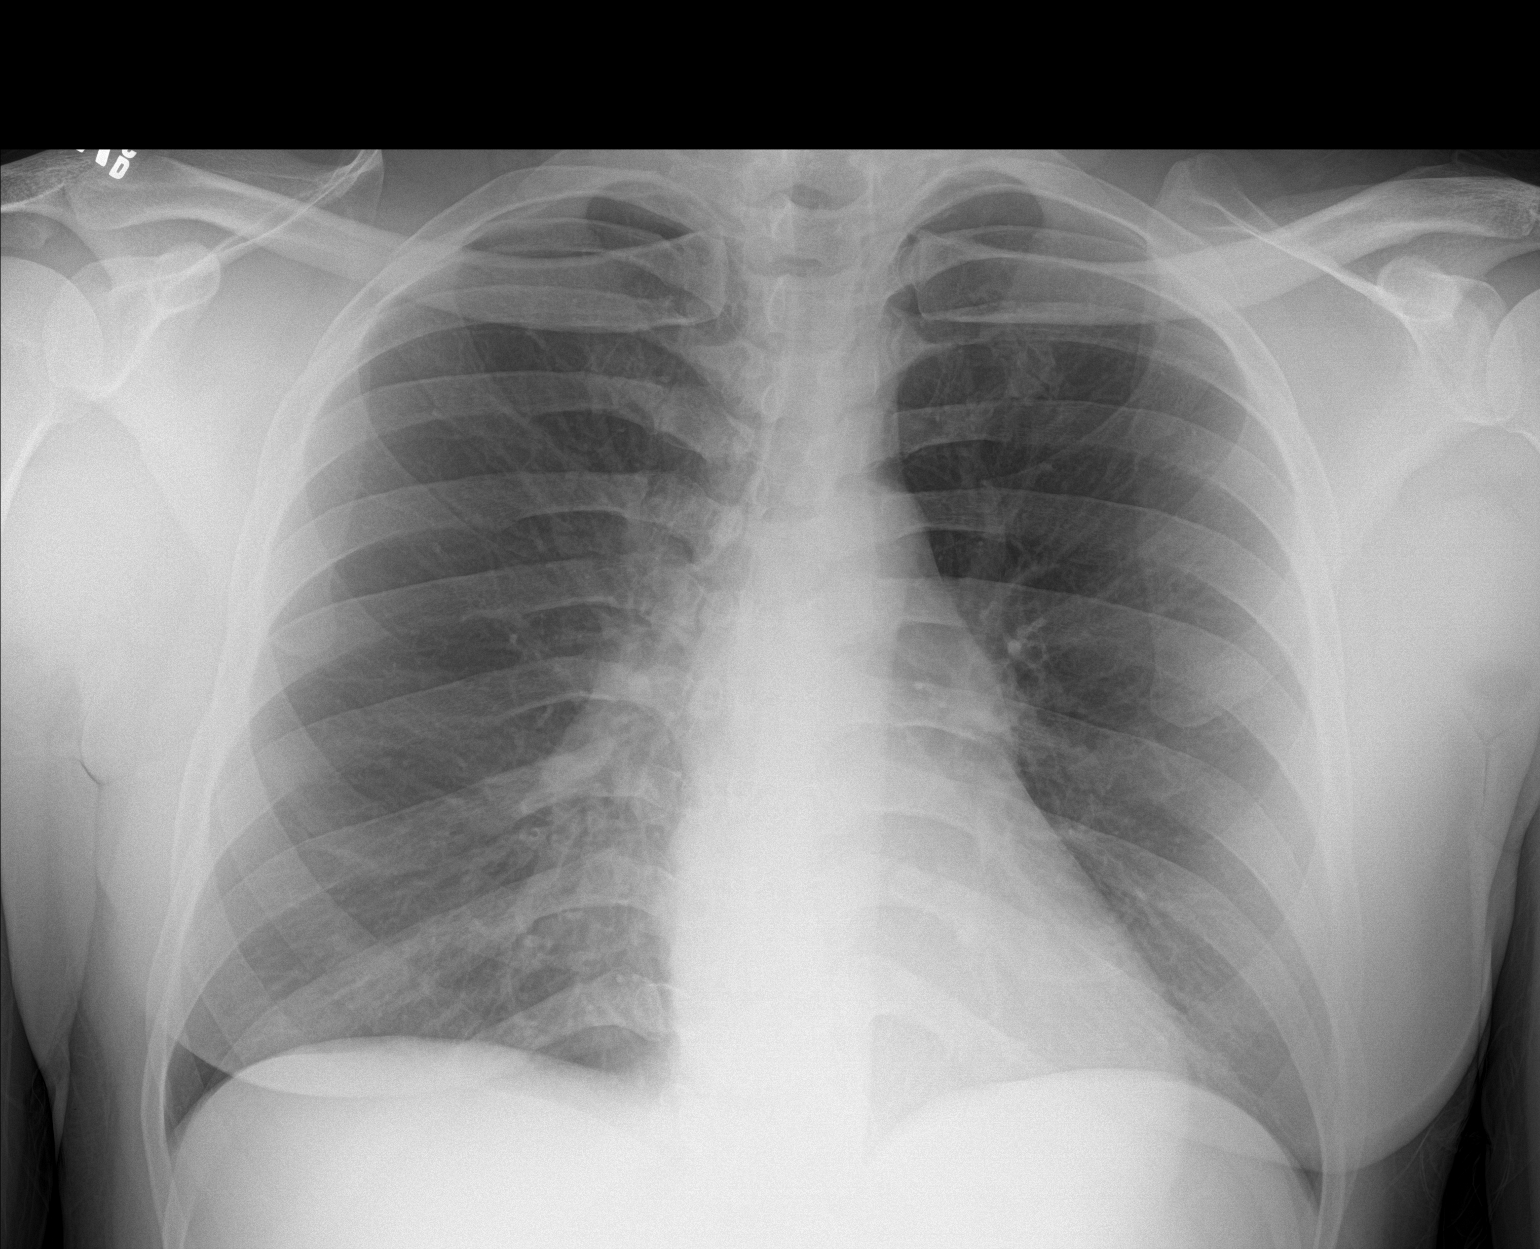

[1 of 1 positions shown; findings below may reference images not displayed]

FINDINGS: Normal heart, mediastinum and hila.

Clear lungs.  No pleural effusion or pneumothorax.

Skeletal structures are grossly unremarkable.
IMPRESSION: No active disease.

## 2022-02-15 ENCOUNTER — Other Ambulatory Visit: Payer: Self-pay

## 2022-02-15 ENCOUNTER — Emergency Department (HOSPITAL_COMMUNITY)
Admission: EM | Admit: 2022-02-15 | Discharge: 2022-02-15 | Discharge status: Against Medical Advice | Payer: Managed Care, Other (non HMO) | Attending: Emergency Medicine | Admitting: Emergency Medicine

## 2022-02-15 ENCOUNTER — Emergency Department (HOSPITAL_COMMUNITY): Payer: Managed Care, Other (non HMO)

## 2022-02-15 ENCOUNTER — Encounter (HOSPITAL_COMMUNITY): Payer: Self-pay

## 2022-02-15 DIAGNOSIS — Z5321 Procedure and treatment not carried out due to patient leaving prior to being seen by health care provider: Secondary | ICD-10-CM | POA: Diagnosis not present

## 2022-02-15 DIAGNOSIS — M549 Dorsalgia, unspecified: Secondary | ICD-10-CM | POA: Insufficient documentation

## 2022-02-15 LAB — BASIC METABOLIC PANEL
Anion gap: 7 (ref 5–15)
BUN: 13 mg/dL (ref 6–20)
CO2: 28 mmol/L (ref 22–32)
Calcium: 9.4 mg/dL (ref 8.9–10.3)
Chloride: 104 mmol/L (ref 98–111)
Creatinine, Ser: 1.03 mg/dL (ref 0.61–1.24)
GFR, Estimated: >60 mL/min (ref >60)
Glucose, Bld: 98 mg/dL (ref 70–99)
Potassium: 4.4 mmol/L (ref 3.5–5.1)
Sodium: 139 mmol/L (ref 135–145)

## 2022-02-15 LAB — CBC
HCT: 48.8 % (ref 39.0–52.0)
Hemoglobin: 15.5 g/dL (ref 13.0–17.0)
MCH: 29 pg (ref 26.0–34.0)
MCHC: 31.8 g/dL (ref 30.0–36.0)
MCV: 91.2 fL (ref 80.0–100.0)
Platelets: 194 10*3/uL (ref 150–400)
RBC: 5.35 MIL/uL (ref 4.22–5.81)
RDW: 14.2 % (ref 11.5–15.5)
WBC: 6.7 10*3/uL (ref 4.0–10.5)
nRBC: 0 % (ref 0.0–0.2)

## 2022-02-15 LAB — URINALYSIS, ROUTINE W REFLEX MICROSCOPIC
Bilirubin Urine: NEGATIVE
Glucose, UA: NEGATIVE mg/dL
Hgb urine dipstick: NEGATIVE
Ketones, ur: NEGATIVE mg/dL
Leukocytes,Ua: NEGATIVE
Nitrite: NEGATIVE
Protein, ur: NEGATIVE mg/dL
Specific Gravity, Urine: 1.02 (ref 1.005–1.030)
pH: 6 (ref 5.0–8.0)

## 2022-02-15 NOTE — ED Notes (Signed)
Pt called for vital reassessment and decided he was done waiting. Pt left before being able to update vitals.

## 2022-02-15 NOTE — ED Provider Triage Note (Signed)
Emergency Medicine Provider Triage Evaluation Note  William Marsh , a 36 y.o. male  was evaluated in triage.  Pt complains of right-sided back pain.  Worse with laughing, moving, coughing.  Reports worsening after he lifted a heavy box.  Has not taken anything for symptoms.  No dysuria, hematuria, fevers.  Review of Systems  Positive: As above  Negative: As above   Physical Exam  BP (!) 133/92 (BP Location: Right Arm)   Pulse 73   Temp 97.8 F (36.6 C) (Oral)   Resp 19   SpO2 100%  Gen:   Awake, no distress   Resp:  Normal effort  MSK:   Moves extremities without difficulty  Other:  Reproducible right-sided thoracic rib cage tenderness.  No CVA tenderness.  Medical Decision Making  Medically screening exam initiated at 9:27 AM.  Appropriate orders placed.  Melanie A Blauvelt was informed that the remainder of the evaluation will be completed by another provider, this initial triage assessment does not replace that evaluation, and the importance of remaining in the ED until their evaluation is complete.     Mare Ferrari, PA-C 02/15/22 [phone]

## 2022-02-15 NOTE — ED Triage Notes (Signed)
Patient said he is having right flank pain since 5pm yesterday. No painful urination.

## 2022-04-12 ENCOUNTER — Emergency Department (HOSPITAL_COMMUNITY)
Admission: EM | Admit: 2022-04-12 | Discharge: 2022-04-12 | Disposition: A | Discharge status: Home / Self Care | Payer: Managed Care, Other (non HMO) | Attending: Emergency Medicine | Admitting: Emergency Medicine

## 2022-04-12 DIAGNOSIS — M546 Pain in thoracic spine: Secondary | ICD-10-CM | POA: Diagnosis not present

## 2022-04-12 DIAGNOSIS — M545 Low back pain, unspecified: Secondary | ICD-10-CM | POA: Diagnosis present

## 2022-04-12 MED ORDER — CYCLOBENZAPRINE HCL 10 MG PO TABS: 10.0000 mg | ORAL_TABLET | Freq: Two times a day (BID) | ORAL | 0 refills | Status: AC | PRN

## 2022-04-12 NOTE — ED Triage Notes (Signed)
Pt reports upper back pain that is worse with bending forward, side to side, and leaning backwards.  Pt reports pain started yesterday after helping someone move.

## 2022-04-12 NOTE — Discharge Instructions (Addendum)
Return to the ED with any new symptoms such as numbness in your penis, weakness in your lower extremities, inability to control bowel or bladder Please read attached guide concerning acute back pain in adults Please take muscle relaxer as prescribed.  Please do not drive or operate heavy machinery under the influence of this medication as it is sedating.  Please alternate ibuprofen and Tylenol as we discussed. Please read attached work note

## 2022-04-12 NOTE — ED Provider Notes (Signed)
[place] AT [facility] Provider Note   CSN: [id number] Arrival date & time: 04/12/22  0732     History  Chief Complaint  Patient presents with   Back Pain    William Marsh is a 37 y.o. male with medical history of hernia repair.  Patient presents to ED for evaluation of thoracic back pain.  Patient reports that yesterday he was helping a friend move for the majority of the day.  Patient reports that in the evening at some point he developed midline thoracic back pain without radiation.  Patient reports that this pain dissipated however returned this morning at work when he lifted up a heavy box.  Patient states that he has not taken any medications for this pain prior to arrival.  Patient denies any trauma to this area.  Patient attributes his pain to heavy lifting.  Patient reports that pain is worse with movement, better with rest.  The patient denies any fevers, nausea, vomiting, IV drug use, numbness or tingling of lower or upper extremities.  Patient nontoxic in appearance.   Back Pain      Home Medications Prior to Admission medications   Medication Sig Start Date End Date Taking? Authorizing Provider  cyclobenzaprine (FLEXERIL) 10 MG tablet Take 1 tablet (10 mg total) by mouth 2 (two) times daily as needed for muscle spasms. 04/12/22  Yes Azucena Cecil, PA-C  ibuprofen (ADVIL) 600 MG tablet Take 1 tablet (600 mg total) by mouth every 6 (six) hours as needed. Patient taking differently: Take 600 mg by mouth every 6 (six) hours as needed for fever or mild pain. 10/22/20   Isla Pence, MD  lidocaine (LIDODERM) 5 % Place 1 patch onto the skin daily as needed. Apply patch to area most significant pain once per day.  Remove and discard patch within 12 hours of application. 10/15/21   Petrucelli, Samantha R, PA-C  methocarbamol (ROBAXIN) 500 MG tablet Take 1 tablet (500 mg total) by mouth every 8 (eight) hours as needed for muscle spasms.  10/15/21   Petrucelli, Glynda Jaeger, PA-C  predniSONE (DELTASONE) 50 MG tablet Take 1 tablet (50 mg total) by mouth daily with breakfast. Start 10/16/21 10/15/21   Petrucelli, Glynda Jaeger, PA-C      Allergies    Penicillins    Review of Systems   Review of Systems  Musculoskeletal:  Positive for back pain.  All other systems reviewed and are negative.   Physical Exam Updated Vital Signs BP 129/80 (BP Location: Left Arm)   Pulse 80   Temp 98.4 F (36.9 C) (Oral)   Resp 18   Wt 93 kg   SpO2 100%   BMI 27.81 kg/m  Physical Exam Vitals and nursing note reviewed.  Constitutional:      General: He is not in acute distress.    Appearance: Normal appearance. He is not ill-appearing, toxic-appearing or diaphoretic.  HENT:     Head: Normocephalic and atraumatic.     Nose: Nose normal. No congestion.     Mouth/Throat:     Mouth: Mucous membranes are moist.     Pharynx: Oropharynx is clear.  Eyes:     Extraocular Movements: Extraocular movements intact.     Conjunctiva/sclera: Conjunctivae normal.     Pupils: Pupils are equal, round, and reactive to light.  Cardiovascular:     Rate and Rhythm: Normal rate and regular rhythm.  Pulmonary:     Effort: Pulmonary effort is normal.  Breath sounds: Normal breath sounds. No wheezing.  Abdominal:     General: Abdomen is flat. Bowel sounds are normal.     Palpations: Abdomen is soft.     Tenderness: There is no abdominal tenderness.  Musculoskeletal:       Arms:     Cervical back: Normal range of motion and neck supple. No tenderness.  Skin:    General: Skin is warm and dry.     Capillary Refill: Capillary refill takes less than 2 seconds.  Neurological:     Mental Status: He is alert and oriented to person, place, and time.     ED Results / Procedures / Treatments   Labs (all labs ordered are listed, but only abnormal results are displayed) Labs Reviewed - No data to display  EKG None  Radiology No results  found.  Procedures Procedures    Medications Ordered in ED Medications - No data to display  ED Course/ Medical Decision Making/ A&P 1}                          Medical Decision Making  37 year old male presents to ED for evaluation.  Please see HPI for further details.  37 year old male presents after helping friend move.  Patient now has thoracic back pain.  Patient reports pain is worse with movement, better with rest.  Patient denies any medications prior to arrival.  Most likely patient pain coming from musculoskeletal cause.  Patient reports that he did help his friend move yesterday and this pain that he is experiencing was exacerbated after arriving to work this morning and lifting heavy box.  The patient was counseled on return precautions and given red flag symptoms to be on the look out for.  The patient will be sent home and advised to take muscle relaxer which I will prescribe as well as ibuprofen and Tylenol alternating.  Patient also encouraged to apply ice and heat, pick up Salonpas patches.  The patient voiced understanding of my instructions.  The patient had all of his questions answered to his satisfaction.  Of note, prior to discharge the patient did ask me to fill out a form that his work has requested that he have completed.  I advised the patient that we typically do not fill these forms out in the ED however I will write him out of work for 2 days.  Patient amenable to plan.  Patient stable at time of discharge.  Final Clinical Impression(s) / ED Diagnoses Final diagnoses:  Acute midline thoracic back pain    Rx / DC Orders ED Discharge Orders          Ordered    cyclobenzaprine (FLEXERIL) 10 MG tablet  2 times daily PRN        04/12/22 0800              Lawana Chambers 04/12/22 0802    Valarie Merino, MD 04/12/22 [id number]

## 2022-11-01 ENCOUNTER — Emergency Department (HOSPITAL_COMMUNITY)
Admission: EM | Admit: 2022-11-01 | Discharge: 2022-11-01 | Disposition: A | Discharge status: Home / Self Care | Payer: Managed Care, Other (non HMO) | Attending: Emergency Medicine | Admitting: Emergency Medicine

## 2022-11-01 ENCOUNTER — Encounter (HOSPITAL_COMMUNITY): Payer: Self-pay

## 2022-11-01 ENCOUNTER — Other Ambulatory Visit: Payer: Self-pay

## 2022-11-01 ENCOUNTER — Emergency Department (HOSPITAL_COMMUNITY): Payer: Managed Care, Other (non HMO)

## 2022-11-01 DIAGNOSIS — M79641 Pain in right hand: Secondary | ICD-10-CM | POA: Insufficient documentation

## 2022-11-01 DIAGNOSIS — X500XXA Overexertion from strenuous movement or load, initial encounter: Secondary | ICD-10-CM | POA: Insufficient documentation

## 2022-11-01 DIAGNOSIS — Y990 Civilian activity done for income or pay: Secondary | ICD-10-CM | POA: Diagnosis not present

## 2022-11-01 NOTE — ED Triage Notes (Signed)
Patient arrived POV. Patient reports hit right hand on door yesterday while helping someone move. Hand painful and swollen since.  AAOX4, respirations even and unlabored. NAD. Denies any

## 2022-11-01 NOTE — Discharge Instructions (Addendum)
It was a pleasure taking care of you!   Your x-ray was negative for fracture or dislocation.  You may take over the counter 600 mg Ibuprofen every 6 hours and alternate with 500 mg Tylenol every 6 hours as needed for pain for no more than 7 days. You will be given a brace today, you may wear as needed throughout the day and remove it at night. You may apply ice to affected area for up to 15 minutes at a time. Ensure to place a barrier between your skin and the ice.  You may follow-up with your primary care provider as needed.  Return to the Emergency Department if you are experiencing increasing/worsening pain, swelling, color change, fever, or worsening symptoms.

## 2022-11-01 NOTE — ED Provider Notes (Signed)
[place] EMERGENCY DEPARTMENT AT [facility] Provider Note   CSN: [id number] Arrival date & time: 11/01/22  1343     History  Chief Complaint  Patient presents with   Hand Pain         William Marsh is a 37 y.o. male who presents to the ED with concerns for left hand injury onset yesterday. Notes that he was at work when he was lifting a heavy item with a partner. When his partner accidentally caused his left hand to become lodged between the wall and the heavy item. No meds tried PTA. Denies swelling or color change.    The history is provided by the patient. No language interpreter was used.       Home Medications Prior to Admission medications   Medication Sig Start Date End Date Taking? Authorizing Provider  cyclobenzaprine (FLEXERIL) 10 MG tablet Take 1 tablet (10 mg total) by mouth 2 (two) times daily as needed for muscle spasms. 04/12/22   Al Decant, PA-C  ibuprofen (ADVIL) 600 MG tablet Take 1 tablet (600 mg total) by mouth every 6 (six) hours as needed. Patient taking differently: Take 600 mg by mouth every 6 (six) hours as needed for fever or mild pain. 10/22/20   Jacalyn Lefevre, MD  lidocaine (LIDODERM) 5 % Place 1 patch onto the skin daily as needed. Apply patch to area most significant pain once per day.  Remove and discard patch within 12 hours of application. 10/15/21   Petrucelli, Samantha R, PA-C  methocarbamol (ROBAXIN) 500 MG tablet Take 1 tablet (500 mg total) by mouth every 8 (eight) hours as needed for muscle spasms. 10/15/21   Petrucelli, Pleas Koch, PA-C  predniSONE (DELTASONE) 50 MG tablet Take 1 tablet (50 mg total) by mouth daily with breakfast. Start 10/16/21 10/15/21   Petrucelli, Pleas Koch, PA-C      Allergies    Penicillins    Review of Systems   Review of Systems  All other systems reviewed and are negative.   Physical Exam Updated Vital Signs BP (!) 140/90 (BP Location: Left Arm)   Pulse 73   Temp 98 F (36.7 C)  (Oral)   Resp 15   Ht 5\' 11"  (1.803 m)   Wt 88.5 kg   SpO2 100%   BMI 27.20 kg/m  Physical Exam Vitals and nursing note reviewed.  Constitutional:      General: He is not in acute distress.    Appearance: Normal appearance.  Eyes:     General: No scleral icterus.    Extraocular Movements: Extraocular movements intact.  Cardiovascular:     Rate and Rhythm: Normal rate.  Pulmonary:     Effort: Pulmonary effort is normal. No respiratory distress.  Musculoskeletal:     Cervical back: Neck supple.     Comments: Mild tenderness to palpation to distal 1st metacarpal and proximal phalanx. No obvious deformity, effusion, erythema, or swelling.  Full active range of motion of all digits. Able to flex and extend wrist without difficulty. Able to flex/extend left 1st digit against resistance without difficulty. Radial pulse intact. Grip strength 5/5. Capillary refill <2 seconds. NVI.   Skin:    General: Skin is warm and dry.     Findings: No bruising, erythema or rash.  Neurological:     Mental Status: He is alert.  Psychiatric:        Behavior: Behavior normal.     ED Results / Procedures / Treatments   Labs (  all labs ordered are listed, but only abnormal results are displayed) Labs Reviewed - No data to display  EKG None  Radiology DG Hand Complete Right  Result Date: 11/01/2022 CLINICAL DATA:  Pain right hand EXAM: RIGHT HAND - COMPLETE 3+ VIEW COMPARISON:  Right wrist done on 08/01/2014 FINDINGS: No recent fracture or dislocation is seen. Small smooth marginated calcification adjacent to the tip of the ulnar styloid has not changed. IMPRESSION: No recent fracture or dislocation is seen in right hand. Electronically Signed   By: Ernie Avena M.D.   On: 11/01/2022 15:29    Procedures Procedures    Medications Ordered in ED Medications - No data to display  ED Course/ Medical Decision Making/ A&P Clinical Course as of 11/01/22 2007  Mon Nov 01, 2022  1533 Discussed  with patient imaging findings and discharge treatment plan. Answered all available questions. Pt appears safe for discharge at this time.  [SB]    Clinical Course User Index [SB] ,  A, PA-C                                 Medical Decision Making Amount and/or Complexity of Data Reviewed Radiology: ordered.   Patient with right hand pain onset yesterday status post getting it jammed between the wall and an item that he was lifting. Vital signs pt afebrile. On exam, patient with Mild tenderness to palpation to distal 1st metacarpal and proximal phalanx. No obvious deformity, effusion, erythema, or swelling.  Full active range of motion of all digits. Able to flex and extend wrist without difficulty. Able to flex/extend left 1st digit against resistance without difficulty. Radial pulse intact. Grip strength 5/5. Capillary refill <2 seconds. NVI. Differential diagnosis includes effusion, fracture, dislocation, sprain, septic arthritis, osteoarthritis.     Imaging: I ordered imaging studies including right hand xray I independently visualized and interpreted imaging which showed: no acute findings I agree with the radiologist interpretation   Disposition: Presentation suspicious for right hand pain, likely sprain. Doubt fracture or dislocation. After consideration of the diagnostic results and the patients response to treatment, I feel that the patient would benefit from Discharge home.  Thumb spica splint given to patient today. Work note provided. Supportive care measures and strict return precautions discussed with patient at bedside. Pt acknowledges and verbalizes understanding. Pt appears safe for discharge. Follow up as indicated in discharge paperwork.    This chart was dictated using voice recognition software, Dragon. Despite the best efforts of this provider to proofread and correct errors, errors may still occur which can change documentation meaning.  Final Clinical  Impression(s) / ED Diagnoses Final diagnoses:  Right hand pain    Rx / DC Orders ED Discharge Orders     None         ,  A, PA-C 11/01/22 2007    Rondel Baton, MD 11/02/22 1754

## 2023-03-30 ENCOUNTER — Encounter (HOSPITAL_COMMUNITY): Payer: Self-pay | Admitting: Emergency Medicine

## 2023-03-30 ENCOUNTER — Emergency Department (HOSPITAL_COMMUNITY)
Admission: EM | Admit: 2023-03-30 | Discharge: 2023-03-30 | Disposition: A | Discharge status: Home / Self Care | Payer: Managed Care, Other (non HMO) | Attending: Emergency Medicine | Admitting: Emergency Medicine

## 2023-03-30 DIAGNOSIS — J029 Acute pharyngitis, unspecified: Secondary | ICD-10-CM

## 2023-03-30 DIAGNOSIS — J020 Streptococcal pharyngitis: Secondary | ICD-10-CM | POA: Insufficient documentation

## 2023-03-30 LAB — GROUP A STREP BY PCR: Group A Strep by PCR: DETECTED — AB

## 2023-03-30 MED ORDER — CLINDAMYCIN HCL 150 MG PO CAPS: 300.0000 mg | ORAL_CAPSULE | Freq: Three times a day (TID) | ORAL | 0 refills | Status: DC

## 2023-03-30 MED ORDER — CLINDAMYCIN HCL 150 MG PO CAPS: 300.0000 mg | ORAL_CAPSULE | Freq: Three times a day (TID) | ORAL | 0 refills | Status: AC

## 2023-03-30 MED ORDER — DEXAMETHASONE 4 MG PO TABS
10.0000 mg | ORAL_TABLET | Freq: Once | ORAL | Status: AC
  Administered 2023-03-30: 10 mg via ORAL
  Filled 2023-03-30: qty 1

## 2023-03-30 NOTE — ED Provider Notes (Signed)
 [place] EMERGENCY DEPARTMENT AT [facility] Provider Note   CSN: [id number] Arrival date & time: 03/30/23  9294     History  Chief Complaint  Patient presents with   Sore Throat    William Marsh is a 38 y.o. male.  HPI     38yo male presents with concern for sore throat.  Reports he began to feel sick around Christmas, initially felt like a cold with cough, congestion and sore throat.  He thought he was feeling a little bit better, but then over the last 4 days he has developed a sore throat.  Reports it is located more on the right side and feels like razor blades when he swallows.  In addition, he bit his tongue and has pain when he is trying to eat and when his teeth touch that side which is also exacerbating his symptoms.  He did have swelling of his cervical lymph nodes he reports last week that is improved.  Denies any shortness of breath, significant voice changes, drooling.  He had fevers initially around Christmas time but has not had recent fever.  No dental pain per se but does have some pain he feels radiating into his right jaw  History reviewed. No pertinent past medical history.   Home Medications Prior to Admission medications   Medication Sig Start Date End Date Taking? Authorizing Provider  clindamycin  (CLEOCIN ) 150 MG capsule Take 2 capsules (300 mg total) by mouth 3 (three) times daily for 10 days. 03/30/23 04/09/23  Dreama Longs, MD  cyclobenzaprine  (FLEXERIL ) 10 MG tablet Take 1 tablet (10 mg total) by mouth 2 (two) times daily as needed for muscle spasms. 04/12/22   Groce, Christopher F, PA-C  ibuprofen  (ADVIL ) 600 MG tablet Take 1 tablet (600 mg total) by mouth every 6 (six) hours as needed. Patient taking differently: Take 600 mg by mouth every 6 (six) hours as needed for fever or mild pain. 10/22/20   Dean Clarity, MD  lidocaine  (LIDODERM ) 5 % Place 1 patch onto the skin daily as needed. Apply patch to area most significant pain once per day.   Remove and discard patch within 12 hours of application. 10/15/21   Petrucelli, Samantha R, PA-C  methocarbamol  (ROBAXIN ) 500 MG tablet Take 1 tablet (500 mg total) by mouth every 8 (eight) hours as needed for muscle spasms. 10/15/21   Petrucelli, Samantha R, PA-C  predniSONE  (DELTASONE ) 50 MG tablet Take 1 tablet (50 mg total) by mouth daily with breakfast. Start 10/16/21 10/15/21   Petrucelli, Samantha R, PA-C      Allergies    Penicillins    Review of Systems   Review of Systems  Physical Exam Updated Vital Signs BP (!) 138/96 (BP Location: Left Arm)   Pulse 77   Temp 98.8 F (37.1 C) (Oral)   Resp 18   Ht 5' 11 (1.803 m)   Wt 88.5 kg   SpO2 100%   BMI 27.21 kg/m  Physical Exam Vitals and nursing note reviewed.  Constitutional:      General: He is not in acute distress.    Appearance: He is well-developed. He is not diaphoretic.  HENT:     Head: Normocephalic and atraumatic.     Mouth/Throat:     Mouth: Mucous membranes are moist.     Pharynx: Uvula midline. Posterior oropharyngeal erythema present. No oropharyngeal exudate or uvula swelling. Pharynx swelling: mild swelling bilaterally.    Tonsils: No tonsillar exudate or tonsillar abscesses.  Eyes:  Conjunctiva/sclera: Conjunctivae normal.  Cardiovascular:     Rate and Rhythm: Normal rate and regular rhythm.  Pulmonary:     Effort: Pulmonary effort is normal. No respiratory distress.     Breath sounds: Normal breath sounds. No stridor. No wheezing or rales.  Musculoskeletal:     Cervical back: Normal range of motion.  Skin:    General: Skin is warm and dry.  Neurological:     Mental Status: He is alert and oriented to person, place, and time.     ED Results / Procedures / Treatments   Labs (all labs ordered are listed, but only abnormal results are displayed) Labs Reviewed  GROUP A STREP BY PCR - Abnormal; Notable for the following components:      Result Value   Group A Strep by PCR DETECTED (*)    All  other components within normal limits    EKG None  Radiology No results found.  Procedures Procedures    Medications Ordered in ED Medications  dexamethasone  (DECADRON ) tablet 10 mg (has no administration in time range)    ED Course/ Medical Decision Making/ A&P                                  38yo male presents with concern for sore throat.    History and exam is not consistent with epiglottitis,.  Tonsillar abscess, retropharyngeal abscess, Ludwig's angina.  Do see area where he bit his tongue without signs of surrounding infection and recommend continue supportive care, time, chewing on the opposite side and soft diet as needed.  Strep testing returned and is positive.  He has a penicillin allergy.  He is given clindamycin  300 mg 3 times daily to take for 10 days for treatment of strep pharyngitis, in addition was given 10 mg of Decadron  in the emergency department.  Recommend continued supportive care.  Patient discharged in stable condition with understanding of reasons to return.          Final Clinical Impression(s) / ED Diagnoses Final diagnoses:  Pharyngitis, unspecified etiology  Strep throat    Rx / DC Orders ED Discharge Orders          Ordered    clindamycin  (CLEOCIN ) 150 MG capsule  3 times daily,   Status:  Discontinued        03/30/23 0950    clindamycin  (CLEOCIN ) 150 MG capsule  3 times daily        03/30/23 1004              Dreama Longs, MD 03/30/23 1008

## 2023-03-30 NOTE — ED Triage Notes (Signed)
 Pt reports cold symptoms a week ago that has improved. Endorses sore throat and requesting strep swab.

## 2023-03-30 NOTE — Discharge Instructions (Addendum)
 You have tested positive for strep throat. Given you have allergy to penicillin, have written you clindamycin for your infection.  Please take this as prescribed for 10 days.

## 2023-06-21 ENCOUNTER — Emergency Department (HOSPITAL_COMMUNITY)
Admission: EM | Admit: 2023-06-21 | Discharge: 2023-06-21 | Disposition: A | Discharge status: Home / Self Care | Attending: Emergency Medicine | Admitting: Emergency Medicine

## 2023-06-21 ENCOUNTER — Encounter (HOSPITAL_COMMUNITY): Payer: Self-pay | Admitting: Emergency Medicine

## 2023-06-21 ENCOUNTER — Other Ambulatory Visit: Payer: Self-pay

## 2023-06-21 DIAGNOSIS — J302 Other seasonal allergic rhinitis: Secondary | ICD-10-CM | POA: Insufficient documentation

## 2023-06-21 DIAGNOSIS — F17200 Nicotine dependence, unspecified, uncomplicated: Secondary | ICD-10-CM | POA: Insufficient documentation

## 2023-06-21 DIAGNOSIS — R059 Cough, unspecified: Secondary | ICD-10-CM | POA: Diagnosis present

## 2023-06-21 MED ORDER — PREDNISONE 50 MG PO TABS
50.0000 mg | ORAL_TABLET | Freq: Once | ORAL | Status: AC
  Administered 2023-06-21: 50 mg via ORAL
  Filled 2023-06-21: qty 1

## 2023-06-21 MED ORDER — CETIRIZINE HCL 5 MG/5ML PO SOLN
5.0000 mg | Freq: Once | ORAL | Status: DC
  Filled 2023-06-21: qty 5

## 2023-06-21 MED ORDER — CETIRIZINE HCL 10 MG PO TABS
10.0000 mg | ORAL_TABLET | ORAL | Status: AC
  Administered 2023-06-21: 10 mg via ORAL
  Filled 2023-06-21: qty 1

## 2023-06-21 MED ORDER — ALBUTEROL SULFATE HFA 108 (90 BASE) MCG/ACT IN AERS
2.0000 | INHALATION_SPRAY | RESPIRATORY_TRACT | Status: DC | PRN
  Administered 2023-06-21: 2 via RESPIRATORY_TRACT
  Filled 2023-06-21: qty 6.7

## 2023-06-21 MED ORDER — PREDNISONE 50 MG PO TABS: 50.0000 mg | ORAL_TABLET | Freq: Every day | ORAL | 0 refills | Status: AC

## 2023-06-21 NOTE — ED Provider Notes (Signed)
 [place] EMERGENCY DEPARTMENT AT [facility] Provider Note   CSN: [id number] Arrival date & time: 06/21/23  2039     History  Chief Complaint  Patient presents with   Allergies   Cough    William Marsh is a 38 y.o. male.  Patient to ED for evaluation of SOB, wheezing, sneezing, itchy watery eyes without fever. He reports getting this symptoms this time every year. No history of asthma but he reports using an inhaler most every year for this. No vomiting. He smokes but states "I stop whenever I have these symptoms".   The history is provided by the patient. No language interpreter was used.  Cough      Home Medications Prior to Admission medications   Medication Sig Start Date End Date Taking? Authorizing Provider  predniSONE (DELTASONE) 50 MG tablet Take 1 tablet (50 mg total) by mouth daily. 06/21/23  Yes Ghazi Rumpf, Melvenia Beam, PA-C  cyclobenzaprine (FLEXERIL) 10 MG tablet Take 1 tablet (10 mg total) by mouth 2 (two) times daily as needed for muscle spasms. 04/12/22   Al Decant, PA-C  ibuprofen (ADVIL) 600 MG tablet Take 1 tablet (600 mg total) by mouth every 6 (six) hours as needed. Patient taking differently: Take 600 mg by mouth every 6 (six) hours as needed for fever or mild pain. 10/22/20   Jacalyn Lefevre, MD  lidocaine (LIDODERM) 5 % Place 1 patch onto the skin daily as needed. Apply patch to area most significant pain once per day.  Remove and discard patch within 12 hours of application. 10/15/21   Petrucelli, Samantha R, PA-C  methocarbamol (ROBAXIN) 500 MG tablet Take 1 tablet (500 mg total) by mouth every 8 (eight) hours as needed for muscle spasms. 10/15/21   Petrucelli, Samantha R, PA-C      Allergies    Penicillins    Review of Systems   Review of Systems  Respiratory:  Positive for cough.     Physical Exam Updated Vital Signs BP (!) 144/95 (BP Location: Left Arm)   Pulse 81   Temp 98 F (36.7 C) (Oral)   Resp 16   Ht 5\' 11"  (1.803 m)    Wt 90.7 kg   SpO2 98%   BMI 27.89 kg/m  Physical Exam Vitals and nursing note reviewed.  Constitutional:      Appearance: He is well-developed.  HENT:     Head: Normocephalic.     Mouth/Throat:     Mouth: Mucous membranes are moist.  Eyes:     Comments: Scleral redness without chemosis or discharge.   Cardiovascular:     Rate and Rhythm: Normal rate and regular rhythm.     Heart sounds: No murmur heard. Pulmonary:     Effort: Pulmonary effort is normal.     Breath sounds: Normal breath sounds. No wheezing, rhonchi or rales.  Abdominal:     General: There is no distension.     Palpations: Abdomen is soft.  Musculoskeletal:        General: Normal range of motion.     Cervical back: Normal range of motion and neck supple.  Skin:    General: Skin is warm and dry.  Neurological:     General: No focal deficit present.     Mental Status: He is alert and oriented to person, place, and time.     ED Results / Procedures / Treatments   Labs (all labs ordered are listed, but only abnormal results are displayed) Labs Reviewed -  No data to display  EKG None  Radiology No results found.  Procedures Procedures    Medications Ordered in ED Medications  predniSONE (DELTASONE) tablet 50 mg (has no administration in time range)  albuterol (VENTOLIN HFA) 108 (90 Base) MCG/ACT inhaler 2 puff (has no administration in time range)  cetirizine HCl (Zyrtec) 5 MG/5ML solution 5 mg (has no administration in time range)    ED Course/ Medical Decision Making/ A&P                                 Medical Decision Making          Final Clinical Impression(s) / ED Diagnoses Final diagnoses:  Seasonal allergies    Rx / DC Orders ED Discharge Orders          Ordered    predniSONE (DELTASONE) 50 MG tablet  Daily        06/21/23 2230              Elpidio Anis, PA-C 06/21/23 2232    Derwood Kaplan, MD 06/22/23 1705

## 2023-06-21 NOTE — Discharge Instructions (Signed)
 Recommend taking Zyrtec or Claritin daily on a regular basis. Take Prednisone as prescribed once daily for the next 3 days. Use the inhaler every 4 hours as needed for any wheezing.

## 2023-06-21 NOTE — ED Triage Notes (Signed)
 Pt presents to the ED via POV with complaints of seasonal allergies and cough causing SOB. Pt states that he develops "asthma-like sx" when there is excessive pollen outside and for the last week his sx have increased. Airway patent - no acute distress noted. A&Ox4 at this time. Denies CP, fevers, chills.
